# Patient Record
Sex: Male | Born: 1955 | Race: White | Hispanic: No | Marital: Single | Smoking: Former smoker
Health system: Southern US, Community
[De-identification: ages and names within clinical notes are randomized; demographics above are authoritative.]

## PROBLEM LIST (undated history)

## (undated) DIAGNOSIS — I10 Essential (primary) hypertension: Secondary | ICD-10-CM

## (undated) DIAGNOSIS — E119 Type 2 diabetes mellitus without complications: Secondary | ICD-10-CM

## (undated) DIAGNOSIS — I2510 Atherosclerotic heart disease of native coronary artery without angina pectoris: Secondary | ICD-10-CM

## (undated) HISTORY — PX: CARPAL TUNNEL RELEASE: SHX101

## (undated) HISTORY — PX: TOE AMPUTATION: SHX809

## (undated) HISTORY — PX: BELOW KNEE LEG AMPUTATION: SUR23

---

## 2016-06-16 ENCOUNTER — Inpatient Hospital Stay (HOSPITAL_COMMUNITY)
Admission: EM | Admit: 2016-06-16 | Discharge: 2016-06-27 | DRG: 233 | Disposition: A | Discharge status: Skilled Nursing Facility | Payer: BLUE CROSS/BLUE SHIELD | Attending: Cardiothoracic Surgery | Admitting: Cardiothoracic Surgery

## 2016-06-16 ENCOUNTER — Emergency Department (HOSPITAL_COMMUNITY): Payer: BLUE CROSS/BLUE SHIELD | Admitting: Anesthesiology

## 2016-06-16 ENCOUNTER — Encounter (HOSPITAL_COMMUNITY): Payer: Self-pay

## 2016-06-16 ENCOUNTER — Encounter (HOSPITAL_COMMUNITY)
Admission: EM | Disposition: A | Discharge status: Skilled Nursing Facility | Payer: Self-pay | Source: Home / Self Care | Attending: Cardiothoracic Surgery

## 2016-06-16 ENCOUNTER — Inpatient Hospital Stay (HOSPITAL_COMMUNITY): Payer: BLUE CROSS/BLUE SHIELD

## 2016-06-16 ENCOUNTER — Emergency Department (HOSPITAL_COMMUNITY): Payer: BLUE CROSS/BLUE SHIELD

## 2016-06-16 DIAGNOSIS — Z87891 Personal history of nicotine dependence: Secondary | ICD-10-CM

## 2016-06-16 DIAGNOSIS — I213 ST elevation (STEMI) myocardial infarction of unspecified site: Secondary | ICD-10-CM | POA: Diagnosis not present

## 2016-06-16 DIAGNOSIS — Z955 Presence of coronary angioplasty implant and graft: Secondary | ICD-10-CM

## 2016-06-16 DIAGNOSIS — Z7902 Long term (current) use of antithrombotics/antiplatelets: Secondary | ICD-10-CM

## 2016-06-16 DIAGNOSIS — E8770 Fluid overload, unspecified: Secondary | ICD-10-CM | POA: Diagnosis present

## 2016-06-16 DIAGNOSIS — D62 Acute posthemorrhagic anemia: Secondary | ICD-10-CM | POA: Diagnosis not present

## 2016-06-16 DIAGNOSIS — Z794 Long term (current) use of insulin: Secondary | ICD-10-CM

## 2016-06-16 DIAGNOSIS — J9811 Atelectasis: Secondary | ICD-10-CM | POA: Diagnosis not present

## 2016-06-16 DIAGNOSIS — I2510 Atherosclerotic heart disease of native coronary artery without angina pectoris: Secondary | ICD-10-CM | POA: Diagnosis present

## 2016-06-16 DIAGNOSIS — Z951 Presence of aortocoronary bypass graft: Secondary | ICD-10-CM

## 2016-06-16 DIAGNOSIS — R570 Cardiogenic shock: Secondary | ICD-10-CM | POA: Diagnosis present

## 2016-06-16 DIAGNOSIS — I2109 ST elevation (STEMI) myocardial infarction involving other coronary artery of anterior wall: Principal | ICD-10-CM | POA: Diagnosis present

## 2016-06-16 DIAGNOSIS — Z9641 Presence of insulin pump (external) (internal): Secondary | ICD-10-CM | POA: Diagnosis present

## 2016-06-16 DIAGNOSIS — Z8249 Family history of ischemic heart disease and other diseases of the circulatory system: Secondary | ICD-10-CM

## 2016-06-16 DIAGNOSIS — I10 Essential (primary) hypertension: Secondary | ICD-10-CM | POA: Diagnosis present

## 2016-06-16 DIAGNOSIS — J189 Pneumonia, unspecified organism: Secondary | ICD-10-CM | POA: Diagnosis present

## 2016-06-16 DIAGNOSIS — J811 Chronic pulmonary edema: Secondary | ICD-10-CM | POA: Diagnosis present

## 2016-06-16 DIAGNOSIS — E1042 Type 1 diabetes mellitus with diabetic polyneuropathy: Secondary | ICD-10-CM | POA: Diagnosis present

## 2016-06-16 DIAGNOSIS — I25110 Atherosclerotic heart disease of native coronary artery with unstable angina pectoris: Secondary | ICD-10-CM

## 2016-06-16 DIAGNOSIS — I255 Ischemic cardiomyopathy: Secondary | ICD-10-CM | POA: Diagnosis present

## 2016-06-16 DIAGNOSIS — R262 Difficulty in walking, not elsewhere classified: Secondary | ICD-10-CM

## 2016-06-16 DIAGNOSIS — I429 Cardiomyopathy, unspecified: Secondary | ICD-10-CM | POA: Diagnosis not present

## 2016-06-16 DIAGNOSIS — E119 Type 2 diabetes mellitus without complications: Secondary | ICD-10-CM | POA: Diagnosis not present

## 2016-06-16 DIAGNOSIS — I313 Pericardial effusion (noninflammatory): Secondary | ICD-10-CM | POA: Diagnosis present

## 2016-06-16 DIAGNOSIS — Z89511 Acquired absence of right leg below knee: Secondary | ICD-10-CM

## 2016-06-16 DIAGNOSIS — Z419 Encounter for procedure for purposes other than remedying health state, unspecified: Secondary | ICD-10-CM

## 2016-06-16 DIAGNOSIS — R5381 Other malaise: Secondary | ICD-10-CM | POA: Diagnosis not present

## 2016-06-16 DIAGNOSIS — I2101 ST elevation (STEMI) myocardial infarction involving left main coronary artery: Secondary | ICD-10-CM | POA: Diagnosis not present

## 2016-06-16 DIAGNOSIS — R0602 Shortness of breath: Secondary | ICD-10-CM

## 2016-06-16 HISTORY — DX: Atherosclerotic heart disease of native coronary artery without angina pectoris: I25.10

## 2016-06-16 HISTORY — PX: CORONARY ARTERY BYPASS GRAFT: SHX141

## 2016-06-16 HISTORY — DX: Type 2 diabetes mellitus without complications: E11.9

## 2016-06-16 HISTORY — DX: Essential (primary) hypertension: I10

## 2016-06-16 HISTORY — PX: CARDIAC CATHETERIZATION: SHX172

## 2016-06-16 HISTORY — PX: ENDOVEIN HARVEST OF GREATER SAPHENOUS VEIN: SHX5059

## 2016-06-16 LAB — POCT I-STAT, CHEM 8
BUN: 23 mg/dL — AB (ref 6–20)
BUN: 21 mg/dL — AB (ref 6–20)
BUN: 26 mg/dL — AB (ref 6–20)
BUN: 20 mg/dL (ref 6–20)
BUN: 19 mg/dL (ref 6–20)
BUN: 18 mg/dL (ref 6–20)
CALCIUM ION: 1.17 mmol/L (ref 1.15–1.40)
CALCIUM ION: 1.02 mmol/L — AB (ref 1.15–1.40)
CHLORIDE: 98 mmol/L — AB (ref 101–111)
CHLORIDE: 98 mmol/L — AB (ref 101–111)
CHLORIDE: 96 mmol/L — AB (ref 101–111)
CHLORIDE: 97 mmol/L — AB (ref 101–111)
CREATININE: 0.7 mg/dL (ref 0.61–1.24)
CREATININE: 0.7 mg/dL (ref 0.61–1.24)
CREATININE: 0.7 mg/dL (ref 0.61–1.24)
Calcium, Ion: 1.11 mmol/L — ABNORMAL LOW (ref 1.15–1.40)
Calcium, Ion: 1.2 mmol/L (ref 1.15–1.40)
Calcium, Ion: 1 mmol/L — ABNORMAL LOW (ref 1.15–1.40)
Calcium, Ion: 1.04 mmol/L — ABNORMAL LOW (ref 1.15–1.40)
Chloride: 95 mmol/L — ABNORMAL LOW (ref 101–111)
Chloride: 97 mmol/L — ABNORMAL LOW (ref 101–111)
Creatinine, Ser: 0.9 mg/dL (ref 0.61–1.24)
Creatinine, Ser: 0.9 mg/dL (ref 0.61–1.24)
Creatinine, Ser: 0.8 mg/dL (ref 0.61–1.24)
GLUCOSE: 218 mg/dL — AB (ref 65–99)
Glucose, Bld: 173 mg/dL — ABNORMAL HIGH (ref 65–99)
Glucose, Bld: 200 mg/dL — ABNORMAL HIGH (ref 65–99)
Glucose, Bld: 210 mg/dL — ABNORMAL HIGH (ref 65–99)
Glucose, Bld: 180 mg/dL — ABNORMAL HIGH (ref 65–99)
Glucose, Bld: 179 mg/dL — ABNORMAL HIGH (ref 65–99)
HCT: 37 % — ABNORMAL LOW (ref 39.0–52.0)
HEMATOCRIT: 42 % (ref 39.0–52.0)
HEMATOCRIT: 41 % (ref 39.0–52.0)
HEMATOCRIT: 27 % — AB (ref 39.0–52.0)
HEMATOCRIT: 28 % — AB (ref 39.0–52.0)
HEMATOCRIT: 26 % — AB (ref 39.0–52.0)
Hemoglobin: 14.3 g/dL (ref 13.0–17.0)
Hemoglobin: 13.9 g/dL (ref 13.0–17.0)
Hemoglobin: 12.6 g/dL — ABNORMAL LOW (ref 13.0–17.0)
Hemoglobin: 9.2 g/dL — ABNORMAL LOW (ref 13.0–17.0)
Hemoglobin: 9.5 g/dL — ABNORMAL LOW (ref 13.0–17.0)
Hemoglobin: 8.8 g/dL — ABNORMAL LOW (ref 13.0–17.0)
POTASSIUM: 4 mmol/L (ref 3.5–5.1)
POTASSIUM: 4.3 mmol/L (ref 3.5–5.1)
POTASSIUM: 4 mmol/L (ref 3.5–5.1)
POTASSIUM: 4.1 mmol/L (ref 3.5–5.1)
Potassium: 4.1 mmol/L (ref 3.5–5.1)
Potassium: 4.6 mmol/L (ref 3.5–5.1)
SODIUM: 140 mmol/L (ref 135–145)
SODIUM: 138 mmol/L (ref 135–145)
SODIUM: 137 mmol/L (ref 135–145)
SODIUM: 136 mmol/L (ref 135–145)
SODIUM: 136 mmol/L (ref 135–145)
Sodium: 137 mmol/L (ref 135–145)
TCO2: 27 mmol/L (ref 0–100)
TCO2: 26 mmol/L (ref 0–100)
TCO2: 26 mmol/L (ref 0–100)
TCO2: 28 mmol/L (ref 0–100)
TCO2: 29 mmol/L (ref 0–100)
TCO2: 25 mmol/L (ref 0–100)

## 2016-06-16 LAB — CBC
HEMATOCRIT: 41.2 % (ref 39.0–52.0)
Hemoglobin: 13.5 g/dL (ref 13.0–17.0)
MCH: 29.2 pg (ref 26.0–34.0)
MCHC: 32.8 g/dL (ref 30.0–36.0)
MCV: 89 fL (ref 78.0–100.0)
PLATELETS: 281 10*3/uL (ref 150–400)
RBC: 4.63 MIL/uL (ref 4.22–5.81)
RDW: 13 % (ref 11.5–15.5)
WBC: 13.4 10*3/uL — AB (ref 4.0–10.5)

## 2016-06-16 LAB — POCT I-STAT 3, ART BLOOD GAS (G3+)
ACID-BASE EXCESS: 3 mmol/L — AB (ref 0.0–2.0)
ACID-BASE EXCESS: 2 mmol/L (ref 0.0–2.0)
Acid-Base Excess: 1 mmol/L (ref 0.0–2.0)
Acid-Base Excess: 5 mmol/L — ABNORMAL HIGH (ref 0.0–2.0)
BICARBONATE: 27.8 mmol/L (ref 20.0–28.0)
BICARBONATE: 30 mmol/L — AB (ref 20.0–28.0)
BICARBONATE: 26.4 mmol/L (ref 20.0–28.0)
Bicarbonate: 26.9 mmol/L (ref 20.0–28.0)
O2 SAT: 94 %
O2 SAT: 96 %
O2 SAT: 100 %
O2 SAT: 99 %
PCO2 ART: 47.1 mmHg (ref 32.0–48.0)
PCO2 ART: 47.2 mmHg (ref 32.0–48.0)
PO2 ART: 86 mmHg (ref 83.0–108.0)
PO2 ART: 239 mmHg — AB (ref 83.0–108.0)
PO2 ART: 118 mmHg — AB (ref 83.0–108.0)
TCO2: 29 mmol/L (ref 0–100)
TCO2: 28 mmol/L (ref 0–100)
TCO2: 31 mmol/L (ref 0–100)
TCO2: 28 mmol/L (ref 0–100)
pCO2 arterial: 40.9 mmHg (ref 32.0–48.0)
pCO2 arterial: 38.9 mmHg (ref 32.0–48.0)
pH, Arterial: 7.439 (ref 7.350–7.450)
pH, Arterial: 7.365 (ref 7.350–7.450)
pH, Arterial: 7.411 (ref 7.350–7.450)
pH, Arterial: 7.44 (ref 7.350–7.450)
pO2, Arterial: 69 mmHg — ABNORMAL LOW (ref 83.0–108.0)

## 2016-06-16 LAB — HEMOGLOBIN AND HEMATOCRIT, BLOOD
HCT: 24.9 % — ABNORMAL LOW (ref 39.0–52.0)
Hemoglobin: 8.4 g/dL — ABNORMAL LOW (ref 13.0–17.0)

## 2016-06-16 LAB — BASIC METABOLIC PANEL
ANION GAP: 9 (ref 5–15)
BUN: 23 mg/dL — AB (ref 6–20)
CALCIUM: 8.9 mg/dL (ref 8.9–10.3)
CO2: 29 mmol/L (ref 22–32)
Chloride: 95 mmol/L — ABNORMAL LOW (ref 101–111)
Creatinine, Ser: 1.08 mg/dL (ref 0.61–1.24)
GFR calc Af Amer: >60 mL/min (ref >60)
Glucose, Bld: 137 mg/dL — ABNORMAL HIGH (ref 65–99)
POTASSIUM: 4.2 mmol/L (ref 3.5–5.1)
SODIUM: 133 mmol/L — AB (ref 135–145)

## 2016-06-16 LAB — LIPID PANEL
CHOLESTEROL: 116 mg/dL (ref 0–200)
HDL: 44 mg/dL (ref >40)
LDL CALC: 51 mg/dL (ref 0–99)
Total CHOL/HDL Ratio: 2.6 RATIO
Triglycerides: 104 mg/dL (ref <150)
VLDL: 21 mg/dL (ref 0–40)

## 2016-06-16 LAB — DIFFERENTIAL
BASOS PCT: 0 %
Basophils Absolute: 0 10*3/uL (ref 0.0–0.1)
EOS PCT: 1 %
Eosinophils Absolute: 0.1 10*3/uL (ref 0.0–0.7)
Lymphocytes Relative: 11 %
Lymphs Abs: 1.5 10*3/uL (ref 0.7–4.0)
MONO ABS: 1.4 10*3/uL — AB (ref 0.1–1.0)
MONOS PCT: 10 %
NEUTROS ABS: 10.5 10*3/uL — AB (ref 1.7–7.7)
Neutrophils Relative %: 78 %

## 2016-06-16 LAB — COMPREHENSIVE METABOLIC PANEL
ALT: 37 U/L (ref 17–63)
AST: 50 U/L — ABNORMAL HIGH (ref 15–41)
Albumin: 3.9 g/dL (ref 3.5–5.0)
Alkaline Phosphatase: 85 U/L (ref 38–126)
Anion gap: 11 (ref 5–15)
BILIRUBIN TOTAL: 0.4 mg/dL (ref 0.3–1.2)
BUN: 23 mg/dL — ABNORMAL HIGH (ref 6–20)
CALCIUM: 9.2 mg/dL (ref 8.9–10.3)
CO2: 28 mmol/L (ref 22–32)
CREATININE: 1.05 mg/dL (ref 0.61–1.24)
Chloride: 98 mmol/L — ABNORMAL LOW (ref 101–111)
GFR calc non Af Amer: >60 mL/min (ref >60)
GLUCOSE: 136 mg/dL — AB (ref 65–99)
Potassium: 4.3 mmol/L (ref 3.5–5.1)
SODIUM: 137 mmol/L (ref 135–145)
TOTAL PROTEIN: 7.5 g/dL (ref 6.5–8.1)

## 2016-06-16 LAB — I-STAT TROPONIN, ED: TROPONIN I, POC: 1.57 ng/mL — AB (ref 0.00–0.08)

## 2016-06-16 LAB — ABO/RH: ABO/RH(D): B POS

## 2016-06-16 LAB — PLATELET COUNT: Platelets: 167 10*3/uL (ref 150–400)

## 2016-06-16 LAB — TROPONIN I: Troponin I: 1.96 ng/mL (ref <0.03)

## 2016-06-16 LAB — PROTIME-INR
INR: 0.94
PROTHROMBIN TIME: 12.6 s (ref 11.4–15.2)

## 2016-06-16 LAB — APTT: aPTT: 32 seconds (ref 24–36)

## 2016-06-16 LAB — PREPARE RBC (CROSSMATCH)

## 2016-06-16 LAB — FIBRINOGEN: Fibrinogen: 289 mg/dL (ref 210–475)

## 2016-06-16 SURGERY — LEFT HEART CATH AND CORONARY ANGIOGRAPHY
Anesthesia: LOCAL

## 2016-06-16 SURGERY — CORONARY ARTERY BYPASS GRAFTING (CABG)
Anesthesia: General | Site: Leg Upper

## 2016-06-16 MED ORDER — VERAPAMIL HCL 2.5 MG/ML IV SOLN
INTRAVENOUS | Status: AC
  Filled 2016-06-16: qty 2

## 2016-06-16 MED ORDER — ONDANSETRON HCL 4 MG/2ML IJ SOLN
INTRAMUSCULAR | Status: AC
  Filled 2016-06-16: qty 2

## 2016-06-16 MED ORDER — DOPAMINE-DEXTROSE 3.2-5 MG/ML-% IV SOLN
0.0000 ug/kg/min | INTRAVENOUS | Status: DC
  Filled 2016-06-16: qty 250

## 2016-06-16 MED ORDER — HEPARIN (PORCINE) IN NACL 2-0.9 UNIT/ML-% IJ SOLN
INTRAMUSCULAR | Status: AC
  Filled 2016-06-16: qty 500

## 2016-06-16 MED ORDER — LIDOCAINE 2% (20 MG/ML) 5 ML SYRINGE
INTRAMUSCULAR | Status: AC
  Filled 2016-06-16: qty 5

## 2016-06-16 MED ORDER — DIAZEPAM 5 MG PO TABS: 5.0000 mg | ORAL_TABLET | Freq: Once | ORAL | Status: DC

## 2016-06-16 MED ORDER — ASPIRIN 81 MG PO CHEW
CHEWABLE_TABLET | ORAL | Status: AC
  Filled 2016-06-16: qty 4

## 2016-06-16 MED ORDER — TRANEXAMIC ACID (OHS) BOLUS VIA INFUSION
15.0000 mg/kg | INTRAVENOUS | Status: AC
  Administered 2016-06-16: 1258.5 mg via INTRAVENOUS
  Filled 2016-06-16: qty 1259

## 2016-06-16 MED ORDER — PHENYLEPHRINE HCL 10 MG/ML IJ SOLN
INTRAMUSCULAR | Status: DC | PRN
  Administered 2016-06-16: 80 ug via INTRAVENOUS

## 2016-06-16 MED ORDER — NITROGLYCERIN 0.4 MG SL SUBL
0.4000 mg | SUBLINGUAL_TABLET | SUBLINGUAL | Status: DC | PRN
  Administered 2016-06-16: 0.4 mg via SUBLINGUAL
  Filled 2016-06-16 (×2): qty 1

## 2016-06-16 MED ORDER — ROCURONIUM BROMIDE 10 MG/ML (PF) SYRINGE
PREFILLED_SYRINGE | INTRAVENOUS | Status: DC | PRN
  Administered 2016-06-16 – 2016-06-17 (×4): 50 mg via INTRAVENOUS

## 2016-06-16 MED ORDER — HEMOSTATIC AGENTS (NO CHARGE) OPTIME
TOPICAL | Status: DC | PRN
  Administered 2016-06-16: 1 via TOPICAL

## 2016-06-16 MED ORDER — PROTAMINE SULFATE 10 MG/ML IV SOLN
INTRAVENOUS | Status: AC
  Filled 2016-06-16: qty 5

## 2016-06-16 MED ORDER — SODIUM CHLORIDE 0.9 % IV SOLN
1250.0000 mg | INTRAVENOUS | Status: AC
  Administered 2016-06-16: 1250 mg via INTRAVENOUS
  Filled 2016-06-16: qty 1250

## 2016-06-16 MED ORDER — LACTATED RINGERS IV SOLN
INTRAVENOUS | Status: DC | PRN
  Administered 2016-06-16 – 2016-06-17 (×2): via INTRAVENOUS

## 2016-06-16 MED ORDER — EPINEPHRINE PF 1 MG/10ML IJ SOSY
PREFILLED_SYRINGE | INTRAMUSCULAR | Status: DC | PRN
  Administered 2016-06-16: 0.2 mg via INTRAVENOUS

## 2016-06-16 MED ORDER — HEMOSTATIC AGENTS (NO CHARGE) OPTIME
TOPICAL | Status: DC | PRN
  Administered 2016-06-16: 1 via TOPICAL
  Administered 2016-06-16: 3 via TOPICAL

## 2016-06-16 MED ORDER — HEPARIN (PORCINE) IN NACL 2-0.9 UNIT/ML-% IJ SOLN
INTRAMUSCULAR | Status: AC
  Filled 2016-06-16: qty 1500

## 2016-06-16 MED ORDER — SUCCINYLCHOLINE CHLORIDE 200 MG/10ML IV SOSY
PREFILLED_SYRINGE | INTRAVENOUS | Status: AC
  Filled 2016-06-16: qty 10

## 2016-06-16 MED ORDER — SODIUM CHLORIDE 0.9 % IJ SOLN
INTRAMUSCULAR | Status: AC
  Filled 2016-06-16: qty 10

## 2016-06-16 MED ORDER — MIDAZOLAM HCL 10 MG/2ML IJ SOLN
INTRAMUSCULAR | Status: AC
  Filled 2016-06-16: qty 2

## 2016-06-16 MED ORDER — EPHEDRINE 5 MG/ML INJ
INTRAVENOUS | Status: AC
  Filled 2016-06-16: qty 20

## 2016-06-16 MED ORDER — TEMAZEPAM 15 MG PO CAPS: 15.0000 mg | ORAL_CAPSULE | Freq: Once | ORAL | Status: DC | PRN

## 2016-06-16 MED ORDER — SUCCINYLCHOLINE CHLORIDE 200 MG/10ML IV SOSY
PREFILLED_SYRINGE | INTRAVENOUS | Status: AC
  Filled 2016-06-16: qty 20

## 2016-06-16 MED ORDER — FENTANYL CITRATE (PF) 250 MCG/5ML IJ SOLN
INTRAMUSCULAR | Status: AC
  Filled 2016-06-16: qty 20

## 2016-06-16 MED ORDER — LIDOCAINE HCL (PF) 1 % IJ SOLN
INTRAMUSCULAR | Status: DC | PRN
  Administered 2016-06-16: 15 mL
  Administered 2016-06-16: 2 mL

## 2016-06-16 MED ORDER — CHLORHEXIDINE GLUCONATE CLOTH 2 % EX PADS: 6.0000 | MEDICATED_PAD | Freq: Once | CUTANEOUS | Status: DC

## 2016-06-16 MED ORDER — CHLORHEXIDINE GLUCONATE 0.12 % MT SOLN
15.0000 mL | Freq: Once | OROMUCOSAL | Status: DC
  Filled 2016-06-16: qty 15

## 2016-06-16 MED ORDER — PHENYLEPHRINE HCL 10 MG/ML IJ SOLN
30.0000 ug/min | INTRAMUSCULAR | Status: DC
Start: 2016-06-16 — End: 2016-06-17
  Filled 2016-06-16: qty 2

## 2016-06-16 MED ORDER — ROCURONIUM BROMIDE 50 MG/5ML IV SOSY
PREFILLED_SYRINGE | INTRAVENOUS | Status: AC
  Filled 2016-06-16: qty 5

## 2016-06-16 MED ORDER — MIDAZOLAM HCL 5 MG/5ML IJ SOLN
INTRAMUSCULAR | Status: DC | PRN
  Administered 2016-06-16 (×2): 2 mg via INTRAVENOUS
  Administered 2016-06-16: 4 mg via INTRAVENOUS
  Administered 2016-06-16: 2 mg via INTRAVENOUS

## 2016-06-16 MED ORDER — PROPOFOL 10 MG/ML IV BOLUS
INTRAVENOUS | Status: AC
  Filled 2016-06-16: qty 20

## 2016-06-16 MED ORDER — PLASMA-LYTE 148 IV SOLN
INTRAVENOUS | Status: AC
  Administered 2016-06-16: 400 mL
  Filled 2016-06-16: qty 2.5

## 2016-06-16 MED ORDER — HEPARIN SODIUM (PORCINE) 1000 UNIT/ML IJ SOLN
INTRAMUSCULAR | Status: AC
  Filled 2016-06-16: qty 1

## 2016-06-16 MED ORDER — TRANEXAMIC ACID (OHS) PUMP PRIME SOLUTION
2.0000 mg/kg | INTRAVENOUS | Status: DC
  Filled 2016-06-16: qty 1.68

## 2016-06-16 MED ORDER — PHENYLEPHRINE 40 MCG/ML (10ML) SYRINGE FOR IV PUSH (FOR BLOOD PRESSURE SUPPORT)
PREFILLED_SYRINGE | INTRAVENOUS | Status: AC
  Filled 2016-06-16: qty 10

## 2016-06-16 MED ORDER — OSELTAMIVIR PHOSPHATE 6 MG/ML PO SUSR
75.0000 mg | Freq: Once | ORAL | Status: AC
  Administered 2016-06-17: 75 mg
  Filled 2016-06-16: qty 12.5

## 2016-06-16 MED ORDER — VERAPAMIL HCL 2.5 MG/ML IV SOLN
INTRAVENOUS | Status: DC | PRN
  Administered 2016-06-16: 10 mL via INTRA_ARTERIAL

## 2016-06-16 MED ORDER — ROCURONIUM BROMIDE 50 MG/5ML IV SOSY
PREFILLED_SYRINGE | INTRAVENOUS | Status: AC
  Filled 2016-06-16: qty 15

## 2016-06-16 MED ORDER — DEXTROSE 5 % IV SOLN
1.5000 g | INTRAVENOUS | Status: AC
  Administered 2016-06-16: .75 g via INTRAVENOUS
  Administered 2016-06-16: 1.5 g via INTRAVENOUS
  Filled 2016-06-16: qty 1.5

## 2016-06-16 MED ORDER — POTASSIUM CHLORIDE 2 MEQ/ML IV SOLN
80.0000 meq | INTRAVENOUS | Status: DC
  Filled 2016-06-16: qty 40

## 2016-06-16 MED ORDER — ALBUTEROL SULFATE HFA 108 (90 BASE) MCG/ACT IN AERS
INHALATION_SPRAY | RESPIRATORY_TRACT | Status: DC | PRN
  Administered 2016-06-16 – 2016-06-17 (×2): 4 via RESPIRATORY_TRACT

## 2016-06-16 MED ORDER — HEPARIN (PORCINE) IN NACL 2-0.9 UNIT/ML-% IJ SOLN
INTRAMUSCULAR | Status: DC | PRN
  Administered 2016-06-16: 1500 mL

## 2016-06-16 MED ORDER — SODIUM CHLORIDE 0.9 % IV SOLN
10.0000 mL/h | INTRAVENOUS | Status: DC
  Administered 2016-06-16: 20 mL/h via INTRAVENOUS

## 2016-06-16 MED ORDER — IOPAMIDOL (ISOVUE-370) INJECTION 76%
INTRAVENOUS | Status: DC | PRN
  Administered 2016-06-16: 55 mL via INTRA_ARTERIAL

## 2016-06-16 MED ORDER — ASPIRIN 81 MG PO CHEW
324.0000 mg | CHEWABLE_TABLET | Freq: Once | ORAL | Status: AC
  Administered 2016-06-16: 324 mg via ORAL

## 2016-06-16 MED ORDER — MAGNESIUM SULFATE 50 % IJ SOLN
40.0000 meq | INTRAMUSCULAR | Status: DC
  Filled 2016-06-16: qty 10

## 2016-06-16 MED ORDER — HEPARIN SODIUM (PORCINE) 1000 UNIT/ML IJ SOLN
INTRAMUSCULAR | Status: DC | PRN
  Administered 2016-06-16: 5000 [IU] via INTRAVENOUS

## 2016-06-16 MED ORDER — HEPARIN SODIUM (PORCINE) 5000 UNIT/ML IJ SOLN
4000.0000 [IU] | INTRAMUSCULAR | Status: AC
  Administered 2016-06-16: 4000 [IU] via INTRAVENOUS
  Filled 2016-06-16: qty 0.8

## 2016-06-16 MED ORDER — SODIUM CHLORIDE 0.9 % IV SOLN: Freq: Once | INTRAVENOUS | Status: DC

## 2016-06-16 MED ORDER — ATROPINE SULFATE 1 MG/ML IJ SOLN
INTRAMUSCULAR | Status: AC
  Filled 2016-06-16: qty 1

## 2016-06-16 MED ORDER — BIVALIRUDIN 250 MG IV SOLR
INTRAVENOUS | Status: AC
  Filled 2016-06-16: qty 250

## 2016-06-16 MED ORDER — BIVALIRUDIN BOLUS VIA INFUSION - CUPID
INTRAVENOUS | Status: DC | PRN
  Administered 2016-06-16: 62.925 mg via INTRAVENOUS

## 2016-06-16 MED ORDER — DEXMEDETOMIDINE HCL IN NACL 400 MCG/100ML IV SOLN
0.1000 ug/kg/h | INTRAVENOUS | Status: AC
  Administered 2016-06-16: .3 ug/kg/h via INTRAVENOUS
  Filled 2016-06-16: qty 100

## 2016-06-16 MED ORDER — LACTATED RINGERS IV SOLN
INTRAVENOUS | Status: DC | PRN
  Administered 2016-06-16: 18:00:00 via INTRAVENOUS

## 2016-06-16 MED ORDER — FENTANYL CITRATE (PF) 250 MCG/5ML IJ SOLN
INTRAMUSCULAR | Status: AC
  Filled 2016-06-16: qty 5

## 2016-06-16 MED ORDER — IOPAMIDOL (ISOVUE-370) INJECTION 76%
INTRAVENOUS | Status: AC
  Filled 2016-06-16: qty 125

## 2016-06-16 MED ORDER — LIDOCAINE HCL (PF) 1 % IJ SOLN
INTRAMUSCULAR | Status: AC
  Filled 2016-06-16: qty 30

## 2016-06-16 MED ORDER — EPINEPHRINE PF 1 MG/ML IJ SOLN
0.0000 ug/min | INTRAVENOUS | Status: AC
  Administered 2016-06-16: 3 ug/min via INTRAVENOUS
  Filled 2016-06-16: qty 4

## 2016-06-16 MED ORDER — CEFAZOLIN SODIUM 1 G IJ SOLR
INTRAMUSCULAR | Status: AC
  Filled 2016-06-16: qty 20

## 2016-06-16 MED ORDER — LACTATED RINGERS IV SOLN
INTRAVENOUS | Status: DC | PRN
  Administered 2016-06-16 (×2): via INTRAVENOUS

## 2016-06-16 MED ORDER — SUCCINYLCHOLINE CHLORIDE 20 MG/ML IJ SOLN
INTRAMUSCULAR | Status: DC | PRN
  Administered 2016-06-16: 140 mg via INTRAVENOUS

## 2016-06-16 MED ORDER — ARTIFICIAL TEARS OP OINT
TOPICAL_OINTMENT | OPHTHALMIC | Status: AC
  Filled 2016-06-16: qty 3.5

## 2016-06-16 MED ORDER — PROTAMINE SULFATE 10 MG/ML IV SOLN
INTRAVENOUS | Status: DC | PRN
  Administered 2016-06-16: 170 mg via INTRAVENOUS

## 2016-06-16 MED ORDER — BISACODYL 5 MG PO TBEC
5.0000 mg | DELAYED_RELEASE_TABLET | Freq: Once | ORAL | Status: DC
  Filled 2016-06-16: qty 1

## 2016-06-16 MED ORDER — SODIUM CHLORIDE 0.9 % IV SOLN
INTRAVENOUS | Status: DC
  Filled 2016-06-16: qty 30

## 2016-06-16 MED ORDER — CHLORHEXIDINE GLUCONATE CLOTH 2 % EX PADS
6.0000 | MEDICATED_PAD | Freq: Once | CUTANEOUS | Status: DC
  Administered 2016-06-17: 6 via TOPICAL

## 2016-06-16 MED ORDER — NITROGLYCERIN IN D5W 200-5 MCG/ML-% IV SOLN
2.0000 ug/min | INTRAVENOUS | Status: AC
  Administered 2016-06-16: 10 ug/min via INTRAVENOUS
  Filled 2016-06-16: qty 250

## 2016-06-16 MED ORDER — LIDOCAINE 2% (20 MG/ML) 5 ML SYRINGE: INTRAMUSCULAR | Status: DC | PRN

## 2016-06-16 MED ORDER — PROTAMINE SULFATE 10 MG/ML IV SOLN
INTRAVENOUS | Status: AC
  Filled 2016-06-16: qty 25

## 2016-06-16 MED ORDER — ARTIFICIAL TEARS OP OINT
TOPICAL_OINTMENT | OPHTHALMIC | Status: DC | PRN
  Administered 2016-06-16: 1 via OPHTHALMIC

## 2016-06-16 MED ORDER — NOREPINEPHRINE BITARTRATE 1 MG/ML IV SOLN
0.0000 ug/min | INTRAVENOUS | Status: AC
  Administered 2016-06-16: 4 ug/min via INTRAVENOUS
  Filled 2016-06-16: qty 4

## 2016-06-16 MED ORDER — METOPROLOL TARTRATE 12.5 MG HALF TABLET: 12.5000 mg | ORAL_TABLET | Freq: Once | ORAL | Status: DC

## 2016-06-16 MED ORDER — PROPOFOL 10 MG/ML IV BOLUS
INTRAVENOUS | Status: DC | PRN
  Administered 2016-06-16: 30 mg via INTRAVENOUS

## 2016-06-16 MED ORDER — FENTANYL CITRATE (PF) 250 MCG/5ML IJ SOLN
INTRAMUSCULAR | Status: DC | PRN
  Administered 2016-06-16: 100 ug via INTRAVENOUS
  Administered 2016-06-16: 250 ug via INTRAVENOUS
  Administered 2016-06-16: 100 ug via INTRAVENOUS
  Administered 2016-06-16: 50 ug via INTRAVENOUS
  Administered 2016-06-16: 500 ug via INTRAVENOUS
  Administered 2016-06-16: 250 ug via INTRAVENOUS

## 2016-06-16 MED ORDER — 0.9 % SODIUM CHLORIDE (POUR BTL) OPTIME
TOPICAL | Status: DC | PRN
  Administered 2016-06-16: 1000 mL
  Administered 2016-06-16: 5000 mL

## 2016-06-16 MED ORDER — SODIUM CHLORIDE 0.9 % IV SOLN: INTRAVENOUS | Status: DC | PRN

## 2016-06-16 MED ORDER — VASOPRESSIN 20 UNIT/ML IV SOLN
0.0300 [IU]/min | INTRAVENOUS | Status: DC
  Filled 2016-06-16: qty 2

## 2016-06-16 MED ORDER — LIDOCAINE 2% (20 MG/ML) 5 ML SYRINGE
INTRAMUSCULAR | Status: AC
  Filled 2016-06-16: qty 10

## 2016-06-16 MED ORDER — SODIUM BICARBONATE 8.4 % IV SOLN
INTRAVENOUS | Status: AC
  Filled 2016-06-16: qty 50

## 2016-06-16 MED ORDER — TRANEXAMIC ACID 1000 MG/10ML IV SOLN
1.5000 mg/kg/h | INTRAVENOUS | Status: AC
  Administered 2016-06-16: 1.5 mg/kg/h via INTRAVENOUS
  Filled 2016-06-16 (×2): qty 25

## 2016-06-16 MED ORDER — SODIUM CHLORIDE 0.9 % IV SOLN
INTRAVENOUS | Status: AC
  Administered 2016-06-16: 3 [IU]/h via INTRAVENOUS
  Filled 2016-06-16: qty 2.5

## 2016-06-16 MED ORDER — HEPARIN SODIUM (PORCINE) 1000 UNIT/ML IJ SOLN
INTRAMUSCULAR | Status: DC | PRN
  Administered 2016-06-16: 2000 [IU] via INTRAVENOUS
  Administered 2016-06-16: 16000 [IU] via INTRAVENOUS
  Administered 2016-06-16: 5000 [IU] via INTRAVENOUS

## 2016-06-16 MED ORDER — DEXTROSE 5 % IV SOLN
750.0000 mg | INTRAVENOUS | Status: DC
  Filled 2016-06-16: qty 750

## 2016-06-16 MED ORDER — MILRINONE LACTATE IN DEXTROSE 20-5 MG/100ML-% IV SOLN
0.2500 ug/kg/min | INTRAVENOUS | Status: AC
  Administered 2016-06-16: .3 ug/kg/min via INTRAVENOUS
  Filled 2016-06-16: qty 100

## 2016-06-16 SURGICAL SUPPLY — 97 items
ADAPTER CARDIO PERF ANTE/RETRO (ADAPTER) ×4 IMPLANT
BAG DECANTER FOR FLEXI CONT (MISCELLANEOUS) ×4 IMPLANT
BANDAGE ACE 4X5 VEL STRL LF (GAUZE/BANDAGES/DRESSINGS) ×4 IMPLANT
BANDAGE ACE 6X5 VEL STRL LF (GAUZE/BANDAGES/DRESSINGS) ×4 IMPLANT
BASKET HEART  (ORDER IN 25'S) (MISCELLANEOUS) ×1
BASKET HEART (ORDER IN 25'S) (MISCELLANEOUS) ×1
BASKET HEART (ORDER IN 25S) (MISCELLANEOUS) ×2 IMPLANT
BLADE STERNUM SYSTEM 6 (BLADE) ×4 IMPLANT
BLADE SURG 12 STRL SS (BLADE) ×4 IMPLANT
BLADE SURG ROTATE 9660 (MISCELLANEOUS) IMPLANT
BNDG GAUZE ELAST 4 BULKY (GAUZE/BANDAGES/DRESSINGS) ×4 IMPLANT
CANISTER SUCTION 2500CC (MISCELLANEOUS) ×4 IMPLANT
CANNULA GUNDRY RCSP 15FR (MISCELLANEOUS) ×4 IMPLANT
CATH CPB KIT VANTRIGT (MISCELLANEOUS) ×4 IMPLANT
CATH ROBINSON RED A/P 18FR (CATHETERS) ×12 IMPLANT
CATH THORACIC 36FR RT ANG (CATHETERS) ×4 IMPLANT
CLIP TI WIDE RED SMALL 24 (CLIP) ×4 IMPLANT
CRADLE DONUT ADULT HEAD (MISCELLANEOUS) ×4 IMPLANT
DRAIN CHANNEL 32F RND 10.7 FF (WOUND CARE) ×4 IMPLANT
DRAPE CARDIOVASCULAR INCISE (DRAPES) ×2
DRAPE SLUSH/WARMER DISC (DRAPES) ×4 IMPLANT
DRAPE SRG 135X102X78XABS (DRAPES) ×2 IMPLANT
DRSG AQUACEL AG ADV 3.5X14 (GAUZE/BANDAGES/DRESSINGS) ×4 IMPLANT
ELECT BLADE 4.0 EZ CLEAN MEGAD (MISCELLANEOUS) ×4
ELECT BLADE 6.5 EXT (BLADE) ×4 IMPLANT
ELECT CAUTERY BLADE 6.4 (BLADE) ×4 IMPLANT
ELECT REM PT RETURN 9FT ADLT (ELECTROSURGICAL) ×8
ELECTRODE BLDE 4.0 EZ CLN MEGD (MISCELLANEOUS) ×2 IMPLANT
ELECTRODE REM PT RTRN 9FT ADLT (ELECTROSURGICAL) ×4 IMPLANT
FELT TEFLON 1X6 (MISCELLANEOUS) ×8 IMPLANT
GAUZE SPONGE 4X4 12PLY STRL (GAUZE/BANDAGES/DRESSINGS) ×8 IMPLANT
GLOVE BIO SURGEON STRL SZ 6.5 (GLOVE) ×3 IMPLANT
GLOVE BIO SURGEON STRL SZ7.5 (GLOVE) ×12 IMPLANT
GLOVE BIO SURGEONS STRL SZ 6.5 (GLOVE) ×1
GLOVE BIOGEL PI IND STRL 6 (GLOVE) ×2 IMPLANT
GLOVE BIOGEL PI IND STRL 6.5 (GLOVE) ×8 IMPLANT
GLOVE BIOGEL PI INDICATOR 6 (GLOVE) ×2
GLOVE BIOGEL PI INDICATOR 6.5 (GLOVE) ×8
GOWN STRL REUS W/ TWL LRG LVL3 (GOWN DISPOSABLE) ×8 IMPLANT
GOWN STRL REUS W/TWL LRG LVL3 (GOWN DISPOSABLE) ×8
HEMOSTAT POWDER SURGIFOAM 1G (HEMOSTASIS) ×12 IMPLANT
HEMOSTAT SURGICEL 2X14 (HEMOSTASIS) ×4 IMPLANT
INSERT FOGARTY XLG (MISCELLANEOUS) IMPLANT
KIT BASIN OR (CUSTOM PROCEDURE TRAY) ×4 IMPLANT
KIT ROOM TURNOVER OR (KITS) ×4 IMPLANT
KIT SUCTION CATH 14FR (SUCTIONS) ×4 IMPLANT
KIT VASOVIEW HEMOPRO VH 3000 (KITS) ×4 IMPLANT
LEAD PACING MYOCARDI (MISCELLANEOUS) ×4 IMPLANT
MARKER GRAFT CORONARY BYPASS (MISCELLANEOUS) ×12 IMPLANT
NS IRRIG 1000ML POUR BTL (IV SOLUTION) ×20 IMPLANT
PACK OPEN HEART (CUSTOM PROCEDURE TRAY) ×4 IMPLANT
PAD ARMBOARD 7.5X6 YLW CONV (MISCELLANEOUS) ×8 IMPLANT
PAD ELECT DEFIB RADIOL ZOLL (MISCELLANEOUS) ×4 IMPLANT
PENCIL BUTTON HOLSTER BLD 10FT (ELECTRODE) ×4 IMPLANT
PUNCH AORTIC ROTATE  4.5MM 8IN (MISCELLANEOUS) ×4 IMPLANT
PUNCH AORTIC ROTATE 4.0MM (MISCELLANEOUS) IMPLANT
PUNCH AORTIC ROTATE 4.5MM 8IN (MISCELLANEOUS) IMPLANT
PUNCH AORTIC ROTATE 5MM 8IN (MISCELLANEOUS) IMPLANT
SET CARDIOPLEGIA MPS 5001102 (MISCELLANEOUS) ×4 IMPLANT
SOLUTION ANTI FOG 6CC (MISCELLANEOUS) ×4 IMPLANT
SPONGE GAUZE 4X4 12PLY STER LF (GAUZE/BANDAGES/DRESSINGS) ×8 IMPLANT
SPONGE LAP 18X18 X RAY DECT (DISPOSABLE) ×4 IMPLANT
SURGIFLO W/THROMBIN 8M KIT (HEMOSTASIS) ×4 IMPLANT
SUT BONE WAX W31G (SUTURE) ×4 IMPLANT
SUT MNCRL AB 4-0 PS2 18 (SUTURE) ×4 IMPLANT
SUT PROLENE 3 0 SH DA (SUTURE) IMPLANT
SUT PROLENE 3 0 SH1 36 (SUTURE) IMPLANT
SUT PROLENE 4 0 RB 1 (SUTURE) ×6
SUT PROLENE 4 0 SH DA (SUTURE) ×4 IMPLANT
SUT PROLENE 4-0 RB1 .5 CRCL 36 (SUTURE) ×6 IMPLANT
SUT PROLENE 5 0 C 1 36 (SUTURE) IMPLANT
SUT PROLENE 6 0 C 1 30 (SUTURE) IMPLANT
SUT PROLENE 6 0 CC (SUTURE) ×12 IMPLANT
SUT PROLENE 8 0 BV175 6 (SUTURE) ×8 IMPLANT
SUT PROLENE BLUE 7 0 (SUTURE) ×12 IMPLANT
SUT SILK  1 MH (SUTURE) ×2
SUT SILK 1 MH (SUTURE) ×2 IMPLANT
SUT SILK 2 0 SH CR/8 (SUTURE) ×4 IMPLANT
SUT SILK 3 0 SH CR/8 (SUTURE) IMPLANT
SUT STEEL 6MS V (SUTURE) ×12 IMPLANT
SUT STEEL SZ 6 DBL 3X14 BALL (SUTURE) ×4 IMPLANT
SUT VIC AB 1 CTX 36 (SUTURE) ×4
SUT VIC AB 1 CTX36XBRD ANBCTR (SUTURE) ×4 IMPLANT
SUT VIC AB 2-0 CT1 27 (SUTURE) ×2
SUT VIC AB 2-0 CT1 TAPERPNT 27 (SUTURE) ×2 IMPLANT
SUT VIC AB 2-0 CTX 27 (SUTURE) IMPLANT
SUT VIC AB 3-0 X1 27 (SUTURE) IMPLANT
SUTURE E-PAK OPEN HEART (SUTURE) ×4 IMPLANT
SYSTEM SAHARA CHEST DRAIN ATS (WOUND CARE) ×4 IMPLANT
TAPE CLOTH SURG 4X10 WHT LF (GAUZE/BANDAGES/DRESSINGS) ×4 IMPLANT
TAPE PAPER 3X10 WHT MICROPORE (GAUZE/BANDAGES/DRESSINGS) ×4 IMPLANT
TOWEL OR 17X24 6PK STRL BLUE (TOWEL DISPOSABLE) ×8 IMPLANT
TOWEL OR 17X26 10 PK STRL BLUE (TOWEL DISPOSABLE) ×8 IMPLANT
TRAY FOLEY IC TEMP SENS 16FR (CATHETERS) ×4 IMPLANT
TUBING INSUFFLATION (TUBING) ×4 IMPLANT
UNDERPAD 30X30 (UNDERPADS AND DIAPERS) ×4 IMPLANT
WATER STERILE IRR 1000ML POUR (IV SOLUTION) ×8 IMPLANT

## 2016-06-16 SURGICAL SUPPLY — 19 items
BALLN LINEAR 7.5FR IABP 40CC (BALLOONS) ×2
BALLOON LINEAR 7.5FR IABP 40CC (BALLOONS) ×1 IMPLANT
CATH EXPO 5FR FR4 (CATHETERS) ×2 IMPLANT
CATH INFINITI 5 FR JL3.5 (CATHETERS) ×2 IMPLANT
CATH INFINITI 5FR ANG PIGTAIL (CATHETERS) ×2 IMPLANT
DEVICE SECURE STATLOCK IABP (MISCELLANEOUS) ×4 IMPLANT
ELECT DEFIB PAD ADLT CADENCE (PAD) ×2 IMPLANT
GLIDESHEATH SLEND SS 6F .021 (SHEATH) ×2 IMPLANT
GUIDEWIRE INQWIRE 1.5J.035X260 (WIRE) ×1 IMPLANT
INQWIRE 1.5J .035X260CM (WIRE) ×2
KIT ENCORE 26 ADVANTAGE (KITS) ×2 IMPLANT
KIT HEART LEFT (KITS) ×2 IMPLANT
PACK CARDIAC CATHETERIZATION (CUSTOM PROCEDURE TRAY) ×2 IMPLANT
SHEATH PINNACLE 6F 10CM (SHEATH) ×2 IMPLANT
SHIELD RADPAD SCOOP 12X17 (MISCELLANEOUS) ×2 IMPLANT
SYR MEDRAD MARK V 150ML (SYRINGE) ×2 IMPLANT
TRANSDUCER W/STOPCOCK (MISCELLANEOUS) ×2 IMPLANT
TUBING CIL FLEX 10 FLL-RA (TUBING) ×2 IMPLANT
WIRE EMERALD 3MM-J .035X150CM (WIRE) ×2 IMPLANT

## 2016-06-16 NOTE — H&P (Addendum)
Patient ID: Lerone Onder MRN: [id number] DOB/AGE: 1955/10/19 61 y.o. Admit date: 06/16/2016  Primary Care Physician: No primary care provider on file. Primary Cardiologist: New ( he is followed by cardiology in [place], [place])  HPI: 61 yo male with history of DM type 1, HTN, CAD with prior stent placement in 2006 presented to [facility] ED with c/o chest pressure, cough, fatigue. He has had low grade fevers, cough and flu-like symptoms for 3 days. He had 3 coronary stents placed in [place] in 2006. No cath since then. EKG with 2 mm ST elevation AVR. Diffuse ST depression. Chest pain resolved upon arrival to cath lab.   Review of systems complete and found to be negative unless listed above   Past Medical History:  Diagnosis Date  . CAD (coronary artery disease)    3 stents placed in ? vessels in [place], [place] in 2006  . Diabetes mellitus without complication (HCC)   . Hypertension     Family History  Problem Relation Age of Onset  . CAD Father     Social History   Social History  . Marital status: N/A    Spouse name: N/A  . Number of children: N/A  . Years of education: N/A   Occupational History  . Not on file.   Social History Main Topics  . Smoking status: Former Games developer  . Smokeless tobacco: Former Neurosurgeon  . Alcohol use Yes     Comment: social  . Drug use: No  . Sexual activity: Not on file   Other Topics Concern  . Not on file   Social History Narrative  . No narrative on file    Past Surgical History:  Procedure Laterality Date  . BELOW KNEE LEG AMPUTATION Right   . CARPAL TUNNEL RELEASE    . TOE AMPUTATION Left     No Known Allergies  Prior to Admission Meds:  Prior to Admission medications   Not on File  Novolog via insulin pump Cymbalta Wellbutrin ASA 81 mg daily Plavix 75 mg daily Lipitor ? dose  Physical Exam: Blood pressure 109/62, pulse 83, temperature 99.4 F (37.4 C), temperature source Oral, resp. rate 15, height 5\' 10"  (1.778 m),  weight 185 lb (83.9 kg), SpO2 94 %.    General: Well developed, well nourished, NAD  HEENT: OP clear, mucus membranes moist  SKIN: warm, dry. No rashes.  Neuro: No focal deficits  Musculoskeletal: Muscle strength 5/5 all ext  Psychiatric: Mood and affect normal  Neck: No JVD, no carotid bruits, no thyromegaly, no lymphadenopathy.  Lungs:Clear bilaterally, no wheezes, rhonci, crackles  Cardiovascular: Regular rate and rhythm. No murmurs, gallops or rubs.  Abdomen:Soft. Bowel sounds present. Non-tender.  Extremities: No lower extremity edema. Pulses are 2 + in the bilateral DP/PT.   Labs:   Lab Results  Component Value Date   WBC 13.4 (H) 06/16/2016   HGB 13.5 06/16/2016   HCT 41.2 06/16/2016   MCV 89.0 06/16/2016   PLT 281 06/16/2016    Radiology: Chest x-ray: No pneumothorax. The heart size borderline. The hila and mediastinum are unremarkable. Interlobular septal thickening is seen peripherally suggesting edema. However, there are more focal opacities in the lung bases.  IMPRESSION: Findings suggest edema with interlobular septal thickening. However, the more focal opacities in the bases, left greater than right, are concerning for multifocal pneumonia given history.  EKG: sinus, 2 mm ST elevation AVR, diffuse ST depression  ASSESSMENT AND PLAN:   1. Acute STEMI: Pt  with ongoing chest pain, EKG changes concerning for diffuse ischemia. Plan emergent cardiac cath. Further plans to follow after cath.   2. Diabetes mellitus: He has an insulin pump.   Addendum: Cardiac cath with severe distal left main disease, severe proximal RCA stenosis, severe ostial LAD stenosis, severe ostial Circumflex stenosis, severe OM2 stenosis within old stent. LVEF 20-25% by LV gram. CT surgery has been called and Dr. Donata ClayVan Trigt is at the bedside. Echo at bedside. Planning for OR tonight for emergent bypass. Pt is pain free with IABP in place.   Earney Hamburghris McAlhany, MD 06/16/2016, 3:46 PM

## 2016-06-16 NOTE — ED Notes (Signed)
EMERGENCY CONTACT- SISTER- LISA BELL [phone]

## 2016-06-16 NOTE — Anesthesia Procedure Notes (Addendum)
Central Venous Catheter Insertion Performed by: JACKSON, CARSWELL, anesthesiologist Start/End1/12/2016 5:53 PM, 06/16/2016 6:04 PM Patient location: OR. Emergency situation Preanesthetic checklist: patient identified, IV checked, site marked, risks and benefits discussed, surgical consent, monitors and equipment checked, pre-op evaluation, timeout performed and anesthesia consent Position: Trendelenburg Lidocaine 1% used for infiltration Hand hygiene performed  and maximum sterile barriers used  Catheter size: 8.5 Fr PA cath was placed.Sheath introducer Swan type:thermodilution Procedure performed using ultrasound guided technique. Ultrasound Notes:anatomy identified, needle tip was noted to be adjacent to the nerve/plexus identified, no ultrasound evidence of intravascular and/or intraneural injection and image(s) printed for medical record Attempts: 1 Following insertion, line sutured and dressing applied. Post procedure assessment: blood return through all ports, free fluid flow and no air  Complications: wire in easily, cordis difficult to insert. Additional procedure comments: PA catheter:  Routine monitors. Timeout, sterile prep, drape, FBP R neck.  Trendelenburg position.  1% Lido local, finder and trocar RIJ 1st pass with US guidance.  Cordis placed over J wire, very difficult to insert, (wire position verified with re-insertion over angiocath). PA catheter in easily.  Sterile dressing applied.  Patient tolerated well, VSS.  C Jackson, MD.           

## 2016-06-16 NOTE — Anesthesia Procedure Notes (Signed)
Procedure Name: Intubation Date/Time: 06/16/2016 6:18 PM Performed by: Brien MatesMAHONY, Dylynn Ketner D Pre-anesthesia Checklist: Patient identified, Emergency Drugs available, Patient being monitored, Suction available and Timeout performed Patient Re-evaluated:Patient Re-evaluated prior to inductionOxygen Delivery Method: Circle system utilized Preoxygenation: Pre-oxygenation with 100% oxygen Intubation Type: IV induction Ventilation: Mask ventilation without difficulty Laryngoscope Size: Glidescope Grade View: Grade I Tube type: Subglottic suction tube Tube size: 8.0 mm Number of attempts: 1 Airway Equipment and Method: Stylet and Video-laryngoscopy Placement Confirmation: ETT inserted through vocal cords under direct vision,  positive ETCO2 and breath sounds checked- equal and bilateral Secured at: 22 cm Tube secured with: Tape Dental Injury: Teeth and Oropharynx as per pre-operative assessment

## 2016-06-16 NOTE — ED Notes (Signed)
PT TRANSFERRED TO [facility] CATH LAB. AAOX4. PT IN NO APPARENT DISTRESS. THE OPPORTUNITY TO ASK QUESTIONS WAS PROVIDED.

## 2016-06-16 NOTE — ED Notes (Signed)
Critical Istat result given to EDP

## 2016-06-16 NOTE — Brief Op Note (Signed)
06/16/2016  10:23 PM  PATIENT:  Bryce Ramirez  61 y.o. male  PRE-OPERATIVE DIAGNOSIS:  MYOCARDIAL INFARCTION - LEFT MAIN  POST-OPERATIVE DIAGNOSIS:  MYOCARDIAL INFARCTION - LEFT MAIN  PROCEDURE:  Procedure(s): CORONARY ARTERY BYPASS GRAFTING (CABG) (N/A)  SVG to RCA SVG to circ LIMA to LAD  SURGEON:  Surgeon(s) and Role:    * Kerin PernaPeter Van Trigt, MD - Primary  PHYSICIAN ASSISTANT:  Jari Favreessa Max Romano, PA-C  ANESTHESIA:   general  EBL:  Total I/O In: -  Out: 1330 [Urine:1330]  BLOOD ADMINISTERED:none  DRAINS: ROUTINE   LOCAL MEDICATIONS USED:  NONE  SPECIMEN:  No Specimen  DISPOSITION OF SPECIMEN:  N/A  COUNTS:  YES  TOURNIQUET:  * No tourniquets in log *  DICTATION: .Other Dictation: Dictation Number [id number]  PLAN OF CARE: Admit to inpatient   PATIENT DISPOSITION:  ICU - intubated and hemodynamically stable.   Delay start of Pharmacological VTE agent (>24hrs) due to surgical blood loss or risk of bleeding: yes

## 2016-06-16 NOTE — ED Triage Notes (Signed)
Patient Bryce Ramirez, c/o flu like symptoms x1 week, generalized body aches and productive cough.  Patient O2 sat on EMS arrival were 87% on RA.  Patient not c/o SOB or chest pain.  Patient applied to 3L [place] in EMS and O2 sats currently at 91%.

## 2016-06-16 NOTE — Consult Note (Signed)
[address]       [place] [address]             [phone]        Bryce Ramirez [facility] Medical Record #[id number] Date of Birth: [birth year]  Referring:  Lauretta Grill, MD Primary Care: No primary care provider on file.  Chief Complaint:    Chief Complaint  Patient presents with  . Chest Pain  . flu like symptoms   Patient examined, cardiac catheterization images reviewed, bedside echocardiogram and portal chest x-ray personally reviewed. Patient discussed with his cardiologist Dr.CMac  in the cardiac cath Ramirez to provide acute coordination of care.  History of Present Illness:     61 year old type I diabetic Caucasian male form smoker, reformed alcoholic with known history of CAD status post prior PCI stents in [place] 2009 presents with chest pain shortness of breath. In the emergency department he was found to have a ST elevation MI with positive enzymes and EKG changes. Chest x-ray showed pulmonary edema and he was brought directly to the cath Ramirez. He was found have a 95% distal left main stenosis, proximal 95% stenosis of the RCA and 99% ostial stenosis of the LAD and circumflex. Previously placed stents have high-grade stenoses. EF is 20% with LVEDP 27 mmHg. Cardiac cath was performed via right femoral artery. A balloon pump was then placed to the right femoral artery sheath to provide hemodynamic support. Bedside echo shows biventricular dysfunction, severe without significant MR The patient 100% Rebreather Mask with PaO2 of 68, He Is Not Acidotic.  Emergency multivessel CABG was recommended by his cardiologist Dr. Kendra Opitz. I agreed with his recommendation and discussed emergency CABG with the patient for treatment of his severe CAD and severe LV dysfunction. He understands that emergency CABG is his best therapy for long-term survival, preservation of ventricular function, and best treatment for symptoms of coronary disease. The patient understands this  emergency operation is at high risk of major morbidity or mortality of 15-20 percent-risk of stroke, bleeding requiring blood transfusion, prolonged ventilator dependence, postop pulmonary problems including pneumonia or pleural effusion, wound infection, multisystem organ failure, and death. He understands these issues and agrees to proceed with surgery. I discussed the operation with his sister Bryce Ramirez at her home in the [place] area.   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   Zubrod Score: At the time of surgery this patient's most appropriate activity status/level should be described as: _0     0    Normal activity, no symptoms _1     1    Restricted in physical strenuous activity but ambulatory, able to do out light work _2     2    Ambulatory and capable of self care, unable to do work activities, up and about                 more than 50%  Of the time                            _3     3    Only limited self care, in bed greater than 50% of waking hours _4     4    Completely disabled, no self care, confined to bed or chair _5     5    Moribund  Past Medical History:  Diagnosis Date  . CAD (coronary artery  disease)    3 stents placed in ? vessels in [place], [place] in 2006  . Diabetes mellitus without complication ([place])   . Hypertension     Past Surgical History:  Procedure Laterality Date  . BELOW KNEE LEG AMPUTATION Right   . CARPAL TUNNEL RELEASE    . TOE AMPUTATION Left     History  Smoking Status  . Former Smoker  Smokeless Tobacco  . Former Systems developer    History  Alcohol Use  . Yes    Comment: social    Social History   Social History  . Marital status: N/A    Spouse name: N/A  . Number of children: N/A  . Years of education: N/A   Occupational History  . Not on file.   Social History Main Topics  . Smoking status: Former Research scientist (life sciences)  . Smokeless tobacco: Former Systems developer  . Alcohol use Yes      Comment: social  . Drug use: No  . Sexual activity: Not on file   Other Topics Concern  . Not on file   Social History Narrative  . No narrative on file    No Known Allergies  Current Facility-Administered Medications  Medication Dose Route Frequency Provider Last Rate Last Dose  . 0.9 %  sodium chloride infusion  10-20 mL/hr Intravenous Continuous Malvin Johns, MD 20 mL/hr at 06/16/16 1515 20 mL/hr at 06/16/16 1515  . aspirin 81 MG chewable tablet           . bisacodyl (DULCOLAX) EC tablet 5 mg  5 mg Oral Once Ivin Poot, MD      . cefUROXime (ZINACEF) 1.5 g in dextrose 5 % 50 mL IVPB  1.5 g Intravenous To OR Ivin Poot, MD      . cefUROXime (ZINACEF) 750 mg in dextrose 5 % 50 mL IVPB  750 mg Intravenous To OR Ivin Poot, MD      . chlorhexidine (PERIDEX) 0.12 % solution 15 mL  15 mL Mouth/Throat Once Ivin Poot, MD      . Chlorhexidine Gluconate Cloth 2 % PADS 6 each  6 each Topical Once Ivin Poot, MD       And  . Chlorhexidine Gluconate Cloth 2 % PADS 6 each  6 each Topical Once Ivin Poot, MD      . dexmedetomidine (PRECEDEX) 400 MCG/100ML (4 mcg/mL) infusion  0.1-0.7 mcg/kg/hr Intravenous To OR Ivin Poot, MD      . Derrill Memo ON 06/17/2016] diazepam (VALIUM) tablet 5 mg  5 mg Oral Once Ivin Poot, MD      . DOPamine (INTROPIN) 800 mg in dextrose 5 % 250 mL (3.2 mg/mL) infusion  0-10 mcg/kg/min Intravenous To OR Ivin Poot, MD      . EPINEPHrine (ADRENALIN) 4 mg in dextrose 5 % 250 mL (0.016 mg/mL) infusion  0-10 mcg/min Intravenous To OR Ivin Poot, MD      . heparin 2,500 Units, papaverine 30 mg in electrolyte-148 (PLASMALYTE-148) 500 mL irrigation   Irrigation To OR Ivin Poot, MD      . heparin 30,000 units/NS 1000 mL solution for CELLSAVER   Other To OR Ivin Poot, MD      . insulin regular (NOVOLIN R,HUMULIN R) 250 Units in sodium chloride 0.9 % 250 mL (1 Units/mL) infusion   Intravenous To OR Ivin Poot, MD      .  magnesium sulfate (IV Push/IM) injection 40 mEq  40 mEq Other To OR Ivin Poot, MD      . Derrill Memo ON 06/17/2016] metoprolol tartrate (LOPRESSOR) tablet 12.5 mg  12.5 mg Oral Once Ivin Poot, MD      . Doug Sou Hold] nitroGLYCERIN (NITROSTAT) SL tablet 0.4 mg  0.4 mg Sublingual Q5 min PRN Malvin Johns, MD   0.4 mg at 06/16/16 1509  . nitroGLYCERIN 50 mg in dextrose 5 % 250 mL (0.2 mg/mL) infusion  2-200 mcg/min Intravenous To OR Ivin Poot, MD      . phenylephrine (NEO-SYNEPHRINE) 20 mg in dextrose 5 % 250 mL (0.08 mg/mL) infusion  30-200 mcg/min Intravenous To OR Ivin Poot, MD      . potassium chloride injection 80 mEq  80 mEq Other To OR Ivin Poot, MD      . temazepam (RESTORIL) capsule 15 mg  15 mg Oral Once PRN Ivin Poot, MD      . tranexamic acid (CYKLOKAPRON) 2,500 mg in sodium chloride 0.9 % 250 mL (10 mg/mL) infusion  1.5 mg/kg/hr Intravenous To OR Ivin Poot, MD      . tranexamic acid (CYKLOKAPRON) bolus via infusion - over 30 minutes 1,258.5 mg  15 mg/kg Intravenous To OR Ivin Poot, MD      . tranexamic acid (CYKLOKAPRON) pump prime solution 168 mg  2 mg/kg Intracatheter To OR Ivin Poot, MD      . vancomycin (VANCOCIN) 1,250 mg in sodium chloride 0.9 % 250 mL IVPB  1,250 mg Intravenous To OR Ivin Poot, MD        No prescriptions prior to admission.    Family History  Problem Relation Age of Onset  . CAD Father      Review of Systems:       Cardiac Review of Systems: Y or N  Chest Pain [ y   ]  Resting SOB Blue.Reese   ] Exertional SOB  [ y ]  Orthopnea y]   Pedal Edema [  n ]    Palpitations [n  ] Syncope  [  ]   Presyncope [ nn  ]  General Review of Systems: [Y] = yes [  ]=no Constitional: recent weight change [  ]; anorexia [  ]; fatigue [  ]; nausea [  ]; night sweats [  ]; fever [  ]; or chills [  ]                                                               Dental: poor dentition[  ]; Last Dentist visit: 3 mos  Eye : blurred  vision [  ]; diplopia [   ]; vision changes [  ];  Amaurosis fugax[  ]; Resp: cough [ y ];  wheezing[  ];  hemoptysis[  ]; shortness of breath[y  ]; paroxysmal nocturnal dyspnea[  ]; dyspnea on exertion[ y ]; or orthopnea[ y ];  GI:  gallstones[  ], vomiting[  ];  dysphagia[  ]; melena[  ];  hematochezia [  ]; heartburn[  ];   Hx of  Colonoscopy[  ]; GU: kidney stones [  ]; hematuria[  ];   dysuria [  ];  nocturia[  ];  history of     obstruction [  ];  urinary frequency [  ]             Skin: rash, swelling[  ];, hair loss[ y ];  peripheral edema[  ];  or itching[  ]; Musculosketetal: myalgias[  ];  joint swelling[  ];  joint erythema[  ];  joint pain[  ];  back pain[y  ];  Heme/Lymph: bruising[  ];  bleeding[  ];  anemia[  ];  Neuro: TIA[  ];  headaches[  ];  stroke[  ];  vertigo[  ];  seizures[  ];   paresthesias[  ];  difficulty walking[  ];  Psych:depression[  ]; anxiety[  ];  Endocrine: diabetes[y uses insulin pump  ];  thyroid dysfunction[  ];  Immunizations: Flu [  ]; Pneumococcal[  ];  Other: Right-hand dominant  Physical Exam: BP (!) 133/93   Pulse 78   Temp 99.4 F (37.4 C) (Oral)   Resp (!) 40   Ht _0  (1.778 m)   Wt 185 lb (83.9 kg)   SpO2 (!) 86%   BMI 26.54 kg/m       Physical Exam  General: Middle-aged well-nourished Caucasian male acute distress in the cardiac cath Ramirez on oxygen mask HEENT: Normocephalic pupils equal , dentition adequate Neck: Supple without JVD, adenopathy, or bruit Chest: Clear to auscultation, symmetrical breath sounds, no rhonchi, no tenderness             or deformity Cardiovascular: Regular rate and rhythm, no murmur, no gallop, peripheral pulses nonpalpable in lower extremities              Abdomen:  Soft, nontender, no palpable mass or organomegaly Extremities: Warm, well-perfused, no clubbing cyanosis edema or tenderness, well-healed right leg stump right BKA. Amputation of left great toe well-healed.              no venous stasis  changes of the legs Rectal/GU: Deferred Neuro: Grossly non--focal and symmetrical throughout Skin: Clean and dry without rash or ulceration    Diagnostic Studies & Laboratory data:     Recent Radiology Findings:   Dg Chest Port 1 View  Result Date: 06/16/2016 CLINICAL DATA:  Flu like symptoms. EXAM: PORTABLE CHEST 1 VIEW COMPARISON:  None. FINDINGS: No pneumothorax. The heart size borderline. The hila and mediastinum are unremarkable. Interlobular septal thickening is seen peripherally suggesting edema. However, there are more focal opacities in the lung bases. IMPRESSION: Findings suggest edema with interlobular septal thickening. However, the more focal opacities in the bases, left greater than right, are concerning for multifocal pneumonia given history. Electronically Signed   By: Dorise Bullion III M.D   On: 06/16/2016 15:18     I have independently reviewed the above radiologic studies.  Recent Ramirez Findings: Ramirez Results  Component Value Date   WBC 13.4 (H) 06/16/2016   HGB 14.3 06/16/2016   HCT 42.0 06/16/2016   PLT 281 06/16/2016   GLUCOSE 173 (H) 06/16/2016   ALT 37 06/16/2016   AST 50 (H) 06/16/2016   NA 140 06/16/2016   K 4.0 06/16/2016   CL 98 (L) 06/16/2016   CREATININE 0.90 06/16/2016   BUN 23 (H) 06/16/2016   CO2 29 06/16/2016   CO2 28 06/16/2016   INR 0.94 06/16/2016      Assessment / Plan:      Acute MI with left main stenosis high-grade coronary disease severe LV dysfunction moderate RV dysfunction Cardiogenic shock with preop balloon pump Type 1 diabetes mellitus History of right BKA Possible  right lower lobe pneumonitis x-ray  The patient agrees to proceed with emergency CABG. Patient agrees to receive blood transfusion therapy as needed. He understands this is a high-risk operation since he is having an MI, since he has pre-existing severe LV dysfunction, and since he is on Plavix and at increased risk of bleeding and major transfusion  requirement   _0 @ 06/16/2016 5:38 PM

## 2016-06-16 NOTE — ED Notes (Signed)
PER CARELINK, THIS IS AN EMERGENCY SITUATION AND THAT IS MORE IMPORTANT THAN THE PT SIGNING CONSENT TO BE TRANSFERRED TO THE CATH LAB.

## 2016-06-16 NOTE — ED Notes (Addendum)
Patient c/o chest pain to this RN.  Patient states that has had flu like symptoms, productive cough x4 days ago.  Chest pain began x3 days ago after eating some Orzo.  No complains of SOB.  Patient is on 3L of O2.  Per EMS patient was 87 on RA from home, put on 3L of O2 and O2 sats are 92%.  Patient states that chest feels like if is burning and aching on the right side.  Patient on Monitor, ambulatory, A&Ox4.  NAD at this time.

## 2016-06-16 NOTE — Progress Notes (Signed)
  Echocardiogram Echocardiogram Transesophageal has been performed.  Janalyn HarderWest, Travelle Mcclimans R 06/16/2016, 6:51 PM

## 2016-06-16 NOTE — Progress Notes (Signed)
  Echocardiogram 2D Echocardiogram has been performed.  Janalyn HarderWest, Laron Angelini R 06/16/2016, 5:33 PM

## 2016-06-16 NOTE — ED Provider Notes (Addendum)
[facility]-EMERGENCY DEPT Provider Note   CSN: [id number] Arrival date & time: 06/16/16  1405     History   Chief Complaint Chief Complaint  Patient presents with  . Chest Pain  . flu like symptoms    HPI Bryce Ramirez is a 61 y.o. male.  Patient is a 61 year old male with a history of diabetes, hypertension and coronary artery disease status post stent placement in 2006. The stents were placed in [place]. He reports chest pain for about the last 4-5 days. It's been intermittent. Describes as a burning sensation in the center of his chest. It's nonradiating. He has associated shortness of breath. He states it's worse at night, particularly when he is lying down. He has also a cough which is productive of grey/green sputum. He's had some subjective fevers. No nausea or diaphoresis. No leg swelling. He is status post a right BKA.      Past Medical History:  Diagnosis Date  . Diabetes mellitus without complication (HCC)   . Hypertension     There are no active problems to display for this patient.   Past Surgical History:  Procedure Laterality Date  . BELOW KNEE LEG AMPUTATION         Home Medications    Prior to Admission medications   Not on File    Family History No family history on file.  Social History Social History  Substance Use Topics  . Smoking status: Former Games developer  . Smokeless tobacco: Former Neurosurgeon  . Alcohol use Yes     Comment: social     Allergies   Patient has no known allergies.   Review of Systems Review of Systems  Constitutional: Positive for fatigue. Negative for chills, diaphoresis and fever.  HENT: Negative for congestion, rhinorrhea and sneezing.   Eyes: Negative.   Respiratory: Positive for cough, chest tightness and shortness of breath.   Cardiovascular: Positive for chest pain. Negative for leg swelling.  Gastrointestinal: Negative for abdominal pain, blood in stool, diarrhea, nausea and vomiting.  Genitourinary: Negative for  difficulty urinating, flank pain, frequency and hematuria.  Musculoskeletal: Negative for arthralgias and back pain.  Skin: Negative for rash.  Neurological: Negative for dizziness, speech difficulty, weakness, numbness and headaches.     Physical Exam Updated Vital Signs BP 116/66 (BP Location: Left Arm)   Pulse 83   Temp 99.4 F (37.4 C) (Oral)   Resp 14   Ht 5\' 10"  (1.778 m)   Wt 185 lb (83.9 kg)   SpO2 91%   BMI 26.54 kg/m   Physical Exam  Constitutional: He is oriented to person, place, and time. He appears well-developed and well-nourished.  HENT:  Head: Normocephalic and atraumatic.  Eyes: Pupils are equal, round, and reactive to light.  Neck: Normal range of motion. Neck supple.  Cardiovascular: Normal rate, regular rhythm and normal heart sounds.   Pulmonary/Chest: Effort normal and breath sounds normal. No respiratory distress. He has no wheezes. He has no rales. He exhibits no tenderness.  Rhonchi bilaterally  Abdominal: Soft. Bowel sounds are normal. There is no tenderness. There is no rebound and no guarding.  Musculoskeletal: Normal range of motion. He exhibits no edema.  Right BKA  Lymphadenopathy:    He has no cervical adenopathy.  Neurological: He is alert and oriented to person, place, and time.  Skin: Skin is warm and dry. No rash noted.  Psychiatric: He has a normal mood and affect.     ED Treatments / Results  Labs (all  labs ordered are listed, but only abnormal results are displayed) Labs Reviewed  CBC - Abnormal; Notable for the following:       Result Value   WBC 13.4 (*)    All other components within normal limits  DIFFERENTIAL - Abnormal; Notable for the following:    Neutro Abs 10.5 (*)    Monocytes Absolute 1.4 (*)    All other components within normal limits  BASIC METABOLIC PANEL  PROTIME-INR  APTT  COMPREHENSIVE METABOLIC PANEL  TROPONIN I  LIPID PANEL  I-STAT TROPOININ, ED    EKG  EKG Interpretation  Date/Time:  Sunday  June 16 2016 14:39:55 EST Ventricular Rate:  83 PR Interval:    QRS Duration: 89 QT Interval:  380 QTC Calculation: 447 R Axis:   23 Text Interpretation:  Sinus rhythm Repol abnrm, severe global ischemia (LM/MVD) ** ** ACUTE MI / STEMI ** ** No old tracing to compare Reconfirmed by Dejae Bernet  MD, Lillith Mcneff [phone]) on 06/16/2016 3:19:29 PM       Radiology Dg Chest Port 1 View  Result Date: 06/16/2016 CLINICAL DATA:  Flu like symptoms. EXAM: PORTABLE CHEST 1 VIEW COMPARISON:  None. FINDINGS: No pneumothorax. The heart size borderline. The hila and mediastinum are unremarkable. Interlobular septal thickening is seen peripherally suggesting edema. However, there are more focal opacities in the lung bases. IMPRESSION: Findings suggest edema with interlobular septal thickening. However, the more focal opacities in the bases, left greater than right, are concerning for multifocal pneumonia given history. Electronically Signed   By: Gerome Sam III M.D   On: 06/16/2016 15:18    Procedures Procedures (including critical care time)  Medications Ordered in ED Medications  0.9 %  sodium chloride infusion (20 mL/hr Intravenous New Bag/Given 06/16/16 1515)  nitroGLYCERIN (NITROSTAT) SL tablet 0.4 mg (0.4 mg Sublingual Given 06/16/16 1509)  aspirin 81 MG chewable tablet (not administered)  aspirin chewable tablet 324 mg (324 mg Oral Given 06/16/16 1500)  heparin injection 4,000 Units (4,000 Units Intravenous Given 06/16/16 1509)     Initial Impression / Assessment and Plan / ED Course  I have reviewed the triage vital signs and the nursing notes.  Pertinent labs & imaging results that were available during my care of the patient were reviewed by me and considered in my medical decision making (see chart for details).  Clinical Course     Patient presents with chest pain cough and shortness of breath. Initial EKG is concerning for acute ischemia. A Code STEMI was activated. Patient is given aspirin,  nitroglycerin and heparin bolus 4000 units. He was noted to be mildly hypoxic on arrival was placed on supplemental oxygen. A portable chest was done given that he has symptoms concerning for pneumonia. There is no suggestions of sepsis. I spoke with Dr.McAlhany with a cardiologist to his accepting the patient.  I did notify him that the pt does have symptoms concerning for pneumonia as well  I also notified the EDP, Dr. Denton Lank.  Of note, pt does have an insulin pump which was he will turn off for now.  PT transferred to [facility]  CRITICAL CARE Performed by: Isela Stantz Total critical care time: 30 minutes Critical care time was exclusive of separately billable procedures and treating other patients. Critical care was necessary to treat or prevent imminent or life-threatening deterioration. Critical care was time spent personally by me on the following activities: development of treatment plan with patient and/or surrogate as well as nursing, discussions with consultants,  evaluation of patient's response to treatment, examination of patient, obtaining history from patient or surrogate, ordering and performing treatments and interventions, ordering and review of laboratory studies, ordering and review of radiographic studies, pulse oximetry and re-evaluation of patient's condition.   Final Clinical Impressions(s) / ED Diagnoses   Final diagnoses:  ST elevation myocardial infarction (STEMI), unspecified artery [facility](HCC)    New Prescriptions New Prescriptions   No medications on file     Rolan BuccoMelanie Kasidee Voisin, MD 06/16/16 1534    Rolan BuccoMelanie Mckenzy Salazar, MD 06/16/16 1535

## 2016-06-16 NOTE — Anesthesia Preprocedure Evaluation (Addendum)
Anesthesia Evaluation  Patient identified by MRN, date of birth, ID band Patient awake    Reviewed: Allergy & Precautions, NPO status , Patient's Chart, lab work & pertinent test results, reviewed documented beta blocker date and time Preop documentation limited or incomplete due to emergent nature of procedure.  History of Anesthesia Complications Negative for: history of anesthetic complications  Airway Mallampati: III  TM Distance: >3 FB Neck ROM: Limited   Comment: Short neck Dental  (+) Chipped, Poor Dentition, Missing, Dental Advisory Given   Pulmonary pneumonia (early Bilat infiltrates), unresolved, Recent URI , former smoker,     + decreased breath sounds (decreased on Right)      Cardiovascular hypertension, Pt. on medications and Pt. on home beta blockers + angina (no CP with IABP in place) at rest + CAD, + Past MI, + Cardiac Stents and + Peripheral Vascular Disease   Rhythm:Regular Rate:Normal  05/16/17 Cardiac cath: severe distal left main disease, severe proximal RCA stenosis, severe ostial LAD stenosis, severe ostial Circumflex stenosis, severe OM2 stenosis within old stent. LVEF 20-25% by LV gram.   Neuro/Psych negative neurological ROS     GI/Hepatic GERD  Poorly Controlled,(+)     substance abuse (pt reports sobriety now)  alcohol use,   Endo/Other  diabetes (glu 173, insulin pump), Insulin Dependent  Renal/GU negative Renal ROS     Musculoskeletal   Abdominal   Peds  Hematology  (+) Blood dyscrasia (plavix), ,   Anesthesia Other Findings   Reproductive/Obstetrics                            Anesthesia Physical Anesthesia Plan  ASA: IV and emergent  Anesthesia Plan: General   Post-op Pain Management:    Induction: Intravenous  Airway Management Planned: Oral ETT and Video Laryngoscope Planned  Additional Equipment: Arterial line, CVP, PA Cath, TEE and Ultrasound  Guidance Line Placement  Intra-op Plan:   Post-operative Plan: Post-operative intubation/ventilation  Informed Consent: I have reviewed the patients History and Physical, chart, labs and discussed the procedure including the risks, benefits and alternatives for the proposed anesthesia with the patient or authorized representative who has indicated his/her understanding and acceptance.   Dental advisory given and Only emergency history available  Plan Discussed with: Surgeon, CRNA and Anesthesiologist  Anesthesia Plan Comments: (Plan routine monitors, A-line, PA cath, GETA with TEE, post op ventilation)       Anesthesia Quick Evaluation

## 2016-06-17 ENCOUNTER — Inpatient Hospital Stay (HOSPITAL_COMMUNITY): Payer: BLUE CROSS/BLUE SHIELD

## 2016-06-17 ENCOUNTER — Encounter (HOSPITAL_COMMUNITY): Payer: Self-pay | Admitting: Cardiovascular Disease

## 2016-06-17 LAB — PREPARE PLATELET PHERESIS
Unit division: 0
Unit division: 0

## 2016-06-17 LAB — CBC
HCT: 35.2 % — ABNORMAL LOW (ref 39.0–52.0)
HCT: 33.4 % — ABNORMAL LOW (ref 39.0–52.0)
HEMATOCRIT: 35.7 % — AB (ref 39.0–52.0)
HEMOGLOBIN: 11.3 g/dL — AB (ref 13.0–17.0)
HEMOGLOBIN: 11.8 g/dL — AB (ref 13.0–17.0)
HEMOGLOBIN: 12 g/dL — AB (ref 13.0–17.0)
MCH: 29.2 pg (ref 26.0–34.0)
MCH: 29.3 pg (ref 26.0–34.0)
MCH: 29.5 pg (ref 26.0–34.0)
MCHC: 33.6 g/dL (ref 30.0–36.0)
MCHC: 33.5 g/dL (ref 30.0–36.0)
MCHC: 33.8 g/dL (ref 30.0–36.0)
MCV: 86.5 fL (ref 78.0–100.0)
MCV: 87.1 fL (ref 78.0–100.0)
MCV: 87.7 fL (ref 78.0–100.0)
Platelets: 285 10*3/uL (ref 150–400)
Platelets: 276 10*3/uL (ref 150–400)
Platelets: 243 10*3/uL (ref 150–400)
RBC: 3.86 MIL/uL — AB (ref 4.22–5.81)
RBC: 4.04 MIL/uL — AB (ref 4.22–5.81)
RBC: 4.07 MIL/uL — ABNORMAL LOW (ref 4.22–5.81)
RDW: 13.5 % (ref 11.5–15.5)
RDW: 13.2 % (ref 11.5–15.5)
RDW: 13.3 % (ref 11.5–15.5)
WBC: 14.4 10*3/uL — AB (ref 4.0–10.5)
WBC: 15.9 10*3/uL — ABNORMAL HIGH (ref 4.0–10.5)
WBC: 16.7 10*3/uL — AB (ref 4.0–10.5)

## 2016-06-17 LAB — INFLUENZA PANEL BY PCR (TYPE A & B)
Influenza A By PCR: NEGATIVE
Influenza B By PCR: NEGATIVE

## 2016-06-17 LAB — BASIC METABOLIC PANEL
Anion gap: 6 (ref 5–15)
BUN: 17 mg/dL (ref 6–20)
CHLORIDE: 105 mmol/L (ref 101–111)
CO2: 27 mmol/L (ref 22–32)
Calcium: 7.5 mg/dL — ABNORMAL LOW (ref 8.9–10.3)
Creatinine, Ser: 0.93 mg/dL (ref 0.61–1.24)
GFR calc Af Amer: >60 mL/min (ref >60)
GFR calc non Af Amer: >60 mL/min (ref >60)
GLUCOSE: 166 mg/dL — AB (ref 65–99)
POTASSIUM: 4.1 mmol/L (ref 3.5–5.1)
Sodium: 138 mmol/L (ref 135–145)

## 2016-06-17 LAB — POCT I-STAT, CHEM 8
BUN: 18 mg/dL (ref 6–20)
BUN: 17 mg/dL (ref 6–20)
CALCIUM ION: 1.08 mmol/L — AB (ref 1.15–1.40)
CREATININE: 0.9 mg/dL (ref 0.61–1.24)
CREATININE: 0.9 mg/dL (ref 0.61–1.24)
Calcium, Ion: 1.06 mmol/L — ABNORMAL LOW (ref 1.15–1.40)
Chloride: 102 mmol/L (ref 101–111)
Chloride: 102 mmol/L (ref 101–111)
GLUCOSE: 145 mg/dL — AB (ref 65–99)
Glucose, Bld: 178 mg/dL — ABNORMAL HIGH (ref 65–99)
HCT: 34 % — ABNORMAL LOW (ref 39.0–52.0)
HCT: 33 % — ABNORMAL LOW (ref 39.0–52.0)
HEMOGLOBIN: 11.2 g/dL — AB (ref 13.0–17.0)
Hemoglobin: 11.6 g/dL — ABNORMAL LOW (ref 13.0–17.0)
Potassium: 4.1 mmol/L (ref 3.5–5.1)
Potassium: 4 mmol/L (ref 3.5–5.1)
SODIUM: 139 mmol/L (ref 135–145)
Sodium: 140 mmol/L (ref 135–145)
TCO2: 24 mmol/L (ref 0–100)
TCO2: 27 mmol/L (ref 0–100)

## 2016-06-17 LAB — POCT I-STAT 3, ART BLOOD GAS (G3+)
ACID-BASE EXCESS: 2 mmol/L (ref 0.0–2.0)
ACID-BASE EXCESS: 2 mmol/L (ref 0.0–2.0)
ACID-BASE EXCESS: 3 mmol/L — AB (ref 0.0–2.0)
BICARBONATE: 26.3 mmol/L (ref 20.0–28.0)
BICARBONATE: 26.5 mmol/L (ref 20.0–28.0)
BICARBONATE: 26.6 mmol/L (ref 20.0–28.0)
O2 SAT: 100 %
O2 Saturation: 95 %
O2 Saturation: 97 %
TCO2: 27 mmol/L (ref 0–100)
TCO2: 28 mmol/L (ref 0–100)
TCO2: 28 mmol/L (ref 0–100)
pCO2 arterial: 35.9 mmHg (ref 32.0–48.0)
pCO2 arterial: 41.1 mmHg (ref 32.0–48.0)
pCO2 arterial: 42.7 mmHg (ref 32.0–48.0)
pH, Arterial: 7.403 (ref 7.350–7.450)
pH, Arterial: 7.422 (ref 7.350–7.450)
pH, Arterial: 7.472 — ABNORMAL HIGH (ref 7.350–7.450)
pO2, Arterial: 223 mmHg — ABNORMAL HIGH (ref 83.0–108.0)
pO2, Arterial: 71 mmHg — ABNORMAL LOW (ref 83.0–108.0)
pO2, Arterial: 91 mmHg (ref 83.0–108.0)

## 2016-06-17 LAB — GLUCOSE, CAPILLARY
GLUCOSE-CAPILLARY: 140 mg/dL — AB (ref 65–99)
GLUCOSE-CAPILLARY: 147 mg/dL — AB (ref 65–99)
GLUCOSE-CAPILLARY: 150 mg/dL — AB (ref 65–99)
GLUCOSE-CAPILLARY: 155 mg/dL — AB (ref 65–99)
GLUCOSE-CAPILLARY: 165 mg/dL — AB (ref 65–99)
GLUCOSE-CAPILLARY: 167 mg/dL — AB (ref 65–99)
GLUCOSE-CAPILLARY: 174 mg/dL — AB (ref 65–99)
GLUCOSE-CAPILLARY: 175 mg/dL — AB (ref 65–99)
Glucose-Capillary: 159 mg/dL — ABNORMAL HIGH (ref 65–99)
Glucose-Capillary: 160 mg/dL — ABNORMAL HIGH (ref 65–99)
Glucose-Capillary: 176 mg/dL — ABNORMAL HIGH (ref 65–99)
Glucose-Capillary: 167 mg/dL — ABNORMAL HIGH (ref 65–99)
Glucose-Capillary: 191 mg/dL — ABNORMAL HIGH (ref 65–99)
Glucose-Capillary: 202 mg/dL — ABNORMAL HIGH (ref 65–99)
Glucose-Capillary: 147 mg/dL — ABNORMAL HIGH (ref 65–99)
Glucose-Capillary: 142 mg/dL — ABNORMAL HIGH (ref 65–99)
Glucose-Capillary: 201 mg/dL — ABNORMAL HIGH (ref 65–99)
Glucose-Capillary: 155 mg/dL — ABNORMAL HIGH (ref 65–99)
Glucose-Capillary: 183 mg/dL — ABNORMAL HIGH (ref 65–99)
Glucose-Capillary: 136 mg/dL — ABNORMAL HIGH (ref 65–99)

## 2016-06-17 LAB — POCT I-STAT 4, (NA,K, GLUC, HGB,HCT)
GLUCOSE: 179 mg/dL — AB (ref 65–99)
HCT: 35 % — ABNORMAL LOW (ref 39.0–52.0)
Hemoglobin: 11.9 g/dL — ABNORMAL LOW (ref 13.0–17.0)
Potassium: 3.6 mmol/L (ref 3.5–5.1)
Sodium: 139 mmol/L (ref 135–145)

## 2016-06-17 LAB — MAGNESIUM
MAGNESIUM: 3 mg/dL — AB (ref 1.7–2.4)
Magnesium: 2.3 mg/dL (ref 1.7–2.4)

## 2016-06-17 LAB — PROTIME-INR
INR: 1.25
PROTHROMBIN TIME: 15.8 s — AB (ref 11.4–15.2)

## 2016-06-17 LAB — CREATININE, SERUM: CREATININE: 0.98 mg/dL (ref 0.61–1.24)

## 2016-06-17 LAB — SURGICAL PCR SCREEN
MRSA, PCR: NEGATIVE
Staphylococcus aureus: NEGATIVE

## 2016-06-17 LAB — ECHOCARDIOGRAM LIMITED
Ao-asc: 30 cm
FS: 11 % — AB (ref 28–44)
Height: 70 in
IV/PV OW: 1
LA ID, A-P, ES: 33 mm
LA diam index: 1.63 cm/m2
LEFT ATRIUM END SYS DIAM: 33 mm
LV PW d: 10 mm — AB (ref 0.6–1.1)
WEIGHTICAEL: 2960 [oz_av]

## 2016-06-17 LAB — COOXEMETRY PANEL
Carboxyhemoglobin: 1 % (ref 0.5–1.5)
Methemoglobin: 1 % (ref 0.0–1.5)
O2 Saturation: 65.2 %
Total hemoglobin: 11.9 g/dL — ABNORMAL LOW (ref 12.0–16.0)

## 2016-06-17 LAB — APTT: APTT: 34 s (ref 24–36)

## 2016-06-17 MED ORDER — SODIUM CHLORIDE 0.9 % IV SOLN
30.0000 meq | Freq: Once | INTRAVENOUS | Status: AC
  Administered 2016-06-17: 30 meq via INTRAVENOUS
  Filled 2016-06-17 (×2): qty 15

## 2016-06-17 MED ORDER — ALBUMIN HUMAN 5 % IV SOLN
250.0000 mL | INTRAVENOUS | Status: AC | PRN
  Administered 2016-06-17 (×2): 250 mL via INTRAVENOUS

## 2016-06-17 MED ORDER — LACTATED RINGERS IV SOLN: 500.0000 mL | Freq: Once | INTRAVENOUS | Status: DC | PRN

## 2016-06-17 MED ORDER — SODIUM CHLORIDE 0.9 % IV SOLN
250.0000 mL | INTRAVENOUS | Status: DC
  Administered 2016-06-17: 250 mL via INTRAVENOUS

## 2016-06-17 MED ORDER — DEXTROSE 5 % IV SOLN
0.0000 ug/min | INTRAVENOUS | Status: DC
  Administered 2016-06-17: 2 ug/min via INTRAVENOUS
  Filled 2016-06-17 (×2): qty 4

## 2016-06-17 MED ORDER — CHLORHEXIDINE GLUCONATE 0.12 % MT SOLN
15.0000 mL | Freq: Two times a day (BID) | OROMUCOSAL | Status: DC
  Administered 2016-06-17: 15 mL via OROMUCOSAL

## 2016-06-17 MED ORDER — VANCOMYCIN HCL IN DEXTROSE 1-5 GM/200ML-% IV SOLN
1000.0000 mg | Freq: Once | INTRAVENOUS | Status: AC
Start: 2016-06-17 — End: 2016-06-17
  Administered 2016-06-17: 1000 mg via INTRAVENOUS
  Filled 2016-06-17: qty 200

## 2016-06-17 MED ORDER — BISACODYL 5 MG PO TBEC
10.0000 mg | DELAYED_RELEASE_TABLET | Freq: Every day | ORAL | Status: DC
  Administered 2016-06-18 – 2016-06-25 (×5): 10 mg via ORAL
  Filled 2016-06-17 (×6): qty 2

## 2016-06-17 MED ORDER — INSULIN REGULAR BOLUS VIA INFUSION
0.0000 [IU] | Freq: Three times a day (TID) | INTRAVENOUS | Status: DC
  Filled 2016-06-17: qty 10

## 2016-06-17 MED ORDER — SODIUM CHLORIDE 0.9 % IV SOLN
INTRAVENOUS | Status: DC
  Administered 2016-06-17: 10 mL/h via INTRAVENOUS
  Administered 2016-06-27: 01:00:00 via INTRAVENOUS

## 2016-06-17 MED ORDER — SODIUM CHLORIDE 0.9 % IV SOLN
30.0000 meq | Freq: Once | INTRAVENOUS | Status: AC
  Filled 2016-06-17: qty 15

## 2016-06-17 MED ORDER — FUROSEMIDE 10 MG/ML IJ SOLN
20.0000 mg | Freq: Two times a day (BID) | INTRAMUSCULAR | Status: DC
  Administered 2016-06-17 – 2016-06-19 (×5): 20 mg via INTRAVENOUS
  Filled 2016-06-17 (×4): qty 2

## 2016-06-17 MED ORDER — TRAMADOL HCL 50 MG PO TABS
50.0000 mg | ORAL_TABLET | ORAL | Status: DC | PRN
  Administered 2016-06-18 – 2016-06-19 (×3): 100 mg via ORAL
  Filled 2016-06-17 (×4): qty 2

## 2016-06-17 MED ORDER — METOPROLOL TARTRATE 12.5 MG HALF TABLET
12.5000 mg | ORAL_TABLET | Freq: Two times a day (BID) | ORAL | Status: DC
  Administered 2016-06-18 – 2016-06-27 (×17): 12.5 mg via ORAL
  Filled 2016-06-17 (×19): qty 1

## 2016-06-17 MED ORDER — VASOPRESSIN 20 UNIT/ML IV SOLN
0.0300 [IU]/min | INTRAVENOUS | Status: DC
  Filled 2016-06-17: qty 2

## 2016-06-17 MED ORDER — ASPIRIN EC 325 MG PO TBEC
325.0000 mg | DELAYED_RELEASE_TABLET | Freq: Every day | ORAL | Status: DC
  Administered 2016-06-20 – 2016-06-27 (×8): 325 mg via ORAL
  Filled 2016-06-17 (×9): qty 1

## 2016-06-17 MED ORDER — DEXMEDETOMIDINE HCL IN NACL 400 MCG/100ML IV SOLN
0.4000 ug/kg/h | INTRAVENOUS | Status: DC
  Administered 2016-06-17 – 2016-06-18 (×5): 1.2 ug/kg/h via INTRAVENOUS
  Administered 2016-06-18: 1 ug/kg/h via INTRAVENOUS
  Filled 2016-06-17 (×6): qty 100

## 2016-06-17 MED ORDER — MAGNESIUM SULFATE 4 GM/100ML IV SOLN
INTRAVENOUS | Status: AC
  Filled 2016-06-17: qty 100

## 2016-06-17 MED ORDER — MILRINONE LACTATE IN DEXTROSE 20-5 MG/100ML-% IV SOLN
0.3000 ug/kg/min | INTRAVENOUS | Status: DC
  Administered 2016-06-17 – 2016-06-18 (×5): 0.3 ug/kg/min via INTRAVENOUS
  Filled 2016-06-17 (×5): qty 100

## 2016-06-17 MED ORDER — MORPHINE SULFATE (PF) 4 MG/ML IV SOLN
INTRAVENOUS | Status: AC
  Administered 2016-06-17: 4 mg
  Filled 2016-06-17: qty 1

## 2016-06-17 MED ORDER — MIDAZOLAM HCL 2 MG/2ML IJ SOLN
2.0000 mg | INTRAMUSCULAR | Status: DC | PRN
  Administered 2016-06-17 – 2016-06-18 (×13): 2 mg via INTRAVENOUS
  Filled 2016-06-17 (×12): qty 2

## 2016-06-17 MED ORDER — MORPHINE SULFATE (PF) 2 MG/ML IV SOLN
1.0000 mg | INTRAVENOUS | Status: AC | PRN
  Administered 2016-06-17 (×2): 4 mg via INTRAVENOUS
  Filled 2016-06-17 (×2): qty 2

## 2016-06-17 MED ORDER — ASPIRIN 81 MG PO CHEW
324.0000 mg | CHEWABLE_TABLET | Freq: Every day | ORAL | Status: DC
  Administered 2016-06-17 – 2016-06-18 (×2): 324 mg
  Filled 2016-06-17 (×2): qty 4

## 2016-06-17 MED ORDER — LACTATED RINGERS IV SOLN
INTRAVENOUS | Status: DC
Start: 2016-06-17 — End: 2016-06-20
  Administered 2016-06-17: 20 mL/h via INTRAVENOUS

## 2016-06-17 MED ORDER — LIDOCAINE HCL (PF) 2 % IJ SOLN
100.0000 mg | Freq: Once | INTRAMUSCULAR | Status: AC
Start: 2016-06-17 — End: 2016-06-17
  Administered 2016-06-17: 100 mg

## 2016-06-17 MED ORDER — ORAL CARE MOUTH RINSE
15.0000 mL | Freq: Two times a day (BID) | OROMUCOSAL | Status: DC
  Administered 2016-06-17 (×2): 15 mL via OROMUCOSAL

## 2016-06-17 MED ORDER — MIDAZOLAM HCL 2 MG/2ML IJ SOLN
INTRAMUSCULAR | Status: AC
  Filled 2016-06-17: qty 2

## 2016-06-17 MED ORDER — FAMOTIDINE IN NACL 20-0.9 MG/50ML-% IV SOLN
20.0000 mg | Freq: Two times a day (BID) | INTRAVENOUS | Status: DC
  Administered 2016-06-17 – 2016-06-19 (×5): 20 mg via INTRAVENOUS
  Filled 2016-06-17 (×5): qty 50

## 2016-06-17 MED ORDER — OXYCODONE HCL 5 MG PO TABS
5.0000 mg | ORAL_TABLET | ORAL | Status: DC | PRN
  Administered 2016-06-18: 10 mg via ORAL
  Administered 2016-06-18: 5 mg via ORAL
  Administered 2016-06-19: 10 mg via ORAL
  Administered 2016-06-19: 5 mg via ORAL
  Filled 2016-06-17: qty 1
  Filled 2016-06-17 (×2): qty 2
  Filled 2016-06-17: qty 1
  Filled 2016-06-17: qty 2
  Filled 2016-06-17: qty 1

## 2016-06-17 MED ORDER — LACTATED RINGERS IV SOLN: INTRAVENOUS | Status: DC

## 2016-06-17 MED ORDER — ONDANSETRON HCL 4 MG/2ML IJ SOLN
4.0000 mg | Freq: Four times a day (QID) | INTRAMUSCULAR | Status: DC | PRN
  Administered 2016-06-18 – 2016-06-19 (×2): 4 mg via INTRAVENOUS
  Filled 2016-06-17 (×2): qty 2

## 2016-06-17 MED ORDER — SODIUM CHLORIDE 0.9 % IV SOLN
INTRAVENOUS | Status: DC
  Administered 2016-06-17: 4.8 [IU]/h via INTRAVENOUS
  Administered 2016-06-18: 3.6 [IU]/h via INTRAVENOUS
  Filled 2016-06-17 (×3): qty 2.5

## 2016-06-17 MED ORDER — ACETAMINOPHEN 650 MG RE SUPP: 650.0000 mg | Freq: Once | RECTAL | Status: AC

## 2016-06-17 MED ORDER — SODIUM CHLORIDE 0.9% FLUSH
3.0000 mL | Freq: Two times a day (BID) | INTRAVENOUS | Status: DC
  Administered 2016-06-17: 3 mL via INTRAVENOUS
  Administered 2016-06-17: 40 mL via INTRAVENOUS
  Administered 2016-06-18 – 2016-06-23 (×8): 3 mL via INTRAVENOUS

## 2016-06-17 MED ORDER — DEXMEDETOMIDINE HCL IN NACL 200 MCG/50ML IV SOLN
0.4000 ug/kg/h | INTRAVENOUS | Status: DC
  Administered 2016-06-17: 0.7 ug/kg/h via INTRAVENOUS
  Administered 2016-06-17: 0.6 ug/kg/h via INTRAVENOUS
  Filled 2016-06-17 (×2): qty 50

## 2016-06-17 MED ORDER — SODIUM CHLORIDE 0.45 % IV SOLN
INTRAVENOUS | Status: DC | PRN
  Administered 2016-06-17: 20 mL/h via INTRAVENOUS

## 2016-06-17 MED ORDER — METOCLOPRAMIDE HCL 5 MG/ML IJ SOLN
10.0000 mg | Freq: Four times a day (QID) | INTRAMUSCULAR | Status: AC
  Administered 2016-06-17 – 2016-06-21 (×19): 10 mg via INTRAVENOUS
  Filled 2016-06-17 (×20): qty 2

## 2016-06-17 MED ORDER — DEXTROSE 5 % IV SOLN
0.0000 ug/min | INTRAVENOUS | Status: DC
  Administered 2016-06-17 (×2): 3 ug/min via INTRAVENOUS
  Filled 2016-06-17 (×3): qty 4

## 2016-06-17 MED ORDER — MORPHINE SULFATE (PF) 2 MG/ML IV SOLN
2.0000 mg | INTRAVENOUS | Status: DC | PRN
  Administered 2016-06-17 (×7): 4 mg via INTRAVENOUS
  Administered 2016-06-18: 2 mg via INTRAVENOUS
  Administered 2016-06-18 (×4): 4 mg via INTRAVENOUS
  Administered 2016-06-18 – 2016-06-19 (×6): 2 mg via INTRAVENOUS
  Filled 2016-06-17 (×3): qty 2
  Filled 2016-06-17: qty 1
  Filled 2016-06-17 (×4): qty 2
  Filled 2016-06-17: qty 1
  Filled 2016-06-17 (×3): qty 2
  Filled 2016-06-17 (×2): qty 1
  Filled 2016-06-17 (×2): qty 2
  Filled 2016-06-17 (×3): qty 1
  Filled 2016-06-17 (×3): qty 2

## 2016-06-17 MED ORDER — ACETAMINOPHEN 160 MG/5ML PO SOLN
650.0000 mg | Freq: Once | ORAL | Status: AC
  Administered 2016-06-17: 650 mg

## 2016-06-17 MED ORDER — DOCUSATE SODIUM 100 MG PO CAPS
200.0000 mg | ORAL_CAPSULE | Freq: Every day | ORAL | Status: DC
  Administered 2016-06-18 – 2016-06-25 (×5): 200 mg via ORAL
  Filled 2016-06-17 (×6): qty 2

## 2016-06-17 MED ORDER — CHLORHEXIDINE GLUCONATE 0.12% ORAL RINSE (MEDLINE KIT)
15.0000 mL | Freq: Two times a day (BID) | OROMUCOSAL | Status: DC
  Administered 2016-06-17 – 2016-06-18 (×2): 15 mL via OROMUCOSAL

## 2016-06-17 MED ORDER — PANTOPRAZOLE SODIUM 40 MG PO TBEC: 40.0000 mg | DELAYED_RELEASE_TABLET | Freq: Every day | ORAL | Status: DC

## 2016-06-17 MED ORDER — VANCOMYCIN HCL IN DEXTROSE 1-5 GM/200ML-% IV SOLN
1000.0000 mg | Freq: Once | INTRAVENOUS | Status: AC
  Administered 2016-06-17: 1000 mg via INTRAVENOUS
  Filled 2016-06-17: qty 200

## 2016-06-17 MED ORDER — METOPROLOL TARTRATE 5 MG/5ML IV SOLN
2.5000 mg | INTRAVENOUS | Status: DC | PRN
  Administered 2016-06-19: 5 mg via INTRAVENOUS
  Filled 2016-06-17: qty 5

## 2016-06-17 MED ORDER — CHLORHEXIDINE GLUCONATE 0.12 % MT SOLN
15.0000 mL | OROMUCOSAL | Status: AC
  Administered 2016-06-17: 15 mL via OROMUCOSAL

## 2016-06-17 MED ORDER — BISACODYL 10 MG RE SUPP
10.0000 mg | Freq: Every day | RECTAL | Status: DC
  Administered 2016-06-17: 10 mg via RECTAL
  Filled 2016-06-17: qty 1

## 2016-06-17 MED ORDER — ORAL CARE MOUTH RINSE
15.0000 mL | OROMUCOSAL | Status: DC
  Administered 2016-06-17 – 2016-06-18 (×8): 15 mL via OROMUCOSAL

## 2016-06-17 MED ORDER — MAGNESIUM SULFATE 4 GM/100ML IV SOLN
4.0000 g | Freq: Once | INTRAVENOUS | Status: AC
  Administered 2016-06-17: 4 g via INTRAVENOUS

## 2016-06-17 MED ORDER — OSELTAMIVIR PHOSPHATE 75 MG PO CAPS
75.0000 mg | ORAL_CAPSULE | Freq: Every day | ORAL | Status: DC
  Filled 2016-06-17: qty 1

## 2016-06-17 MED ORDER — FAMOTIDINE IN NACL 20-0.9 MG/50ML-% IV SOLN
20.0000 mg | Freq: Two times a day (BID) | INTRAVENOUS | Status: AC
  Administered 2016-06-17 (×2): 20 mg via INTRAVENOUS
  Filled 2016-06-17: qty 50

## 2016-06-17 MED ORDER — SODIUM CHLORIDE 0.9% FLUSH: 3.0000 mL | INTRAVENOUS | Status: DC | PRN

## 2016-06-17 MED ORDER — METOPROLOL TARTRATE 25 MG/10 ML ORAL SUSPENSION
12.5000 mg | Freq: Two times a day (BID) | ORAL | Status: DC
  Administered 2016-06-18 – 2016-06-20 (×2): 12.5 mg
  Filled 2016-06-17 (×2): qty 5

## 2016-06-17 MED ORDER — DEXTROSE 5 % IV SOLN
1.5000 g | Freq: Two times a day (BID) | INTRAVENOUS | Status: AC
  Administered 2016-06-17 – 2016-06-18 (×4): 1.5 g via INTRAVENOUS
  Filled 2016-06-17 (×4): qty 1.5

## 2016-06-17 MED ORDER — ACETAMINOPHEN 160 MG/5ML PO SOLN
1000.0000 mg | Freq: Four times a day (QID) | ORAL | Status: AC
  Administered 2016-06-17 – 2016-06-18 (×4): 1000 mg
  Filled 2016-06-17 (×4): qty 40.6

## 2016-06-17 MED ORDER — PNEUMOCOCCAL VAC POLYVALENT 25 MCG/0.5ML IJ INJ
0.5000 mL | INJECTION | INTRAMUSCULAR | Status: DC
  Filled 2016-06-17: qty 0.5

## 2016-06-17 MED ORDER — ACETAMINOPHEN 500 MG PO TABS
1000.0000 mg | ORAL_TABLET | Freq: Four times a day (QID) | ORAL | Status: AC
  Administered 2016-06-18 – 2016-06-21 (×11): 1000 mg via ORAL
  Filled 2016-06-17 (×12): qty 2

## 2016-06-17 MED FILL — Potassium Chloride Inj 2 mEq/ML: INTRAVENOUS | Qty: 40 | Status: AC

## 2016-06-17 MED FILL — Heparin Sodium (Porcine) Inj 1000 Unit/ML: INTRAMUSCULAR | Qty: 30 | Status: AC

## 2016-06-17 MED FILL — Magnesium Sulfate Inj 50%: INTRAMUSCULAR | Qty: 10 | Status: AC

## 2016-06-17 NOTE — Transfer of Care (Signed)
Immediate Anesthesia Transfer of Care Note  Patient: Bryce Ramirez  Procedure(s) Performed: Procedure(s): CORONARY ARTERY BYPASS GRAFTING (CABG) x 3 (N/A) ENDOVEIN HARVEST OF GREATER SAPHENOUS VEIN (Left)  Patient Location: SICU  Anesthesia Type:General  Level of Consciousness: oriented and Patient remains intubated per anesthesia plan  Airway & Oxygen Therapy: Patient remains intubated per anesthesia plan and Patient placed on Ventilator (see vital sign flow sheet for setting)  Post-op Assessment: Report given to RN and Post -op Vital signs reviewed and stable  Post vital signs: Reviewed and stable  Last Vitals:  Vitals:   06/16/16 1723 06/17/16 0035  BP: (!) 133/93 134/75  Pulse: 78 93  Resp: (!) 40 16  Temp:      Last Pain:  Vitals:   06/16/16 1703  TempSrc:   PainSc: 2       Patients Stated Pain Goal: 2 (06/16/16 1522)  Complications: No apparent anesthesia complications

## 2016-06-17 NOTE — Progress Notes (Signed)
  232 am called to remove 6 fr sheath and place TR Band. Pt. Intubated and sedated. R upper hand pre- sheath removal noted warm, pale with cap refill less than 3 seconds, noted pre-sheath removal sheath was not connected to pressure line, was able to withdraw 10 cc of heme from sheath prior to sheath removal, sutures removed, sheath removed without difficulty, TR banc placed with 11 cc of air in TR band, Al, RN at bedside, SpO2% probe to finger noting 100 %, slight radial pulse palpable. Pt. Remained intubated and sedated.with Al, RN at bedside.

## 2016-06-17 NOTE — Progress Notes (Signed)
Inpatient Diabetes Program Recommendations  AACE/ADA: New Consensus Statement on Inpatient Glycemic Control (2015)  Target Ranges:  Prepandial:   less than 140 mg/dL      Peak postprandial:   less than 180 mg/dL (1-2 hours)      Critically ill patients:  140 - 180 mg/dL   Lab Results  Component Value Date   GLUCAP 147 (H) 06/17/2016    Review of Glycemic ControlResults for Bryce Ramirez, Bryce Ramirez (MRN [id number]) as of 06/17/2016 15:45  Ref. Range 06/17/2016 08:58 06/17/2016 11:19 06/17/2016 12:07 06/17/2016 12:54 06/17/2016 13:54  Glucose-Capillary Latest Ref Range: 65 - 99 mg/dL 086167 (H) 578191 (H) 469202 (H) 174 (H) 147 (H)    Diabetes history: Type 1 diabetes Outpatient Diabetes medications: Insulin pump-Medtronic 670G Current orders for Inpatient glycemic control:  IV insulin post surgery  Inpatient Diabetes Program Recommendations:    Reviewed insulin pump which is disconnected from patient.  He has insulin pump that also has sensor which adjusts according to blood sugars.  His average basal dose is approximately 40.9 units/24 hours and he covers 1 unit for every 10 grams of CHO.  Agree with IV insulin at this time.  Will continue to follow.  Discussed with RN.   Thanks, Beryl MeagerJenny Murel Wigle, RN, BC-ADM Inpatient Diabetes Coordinator Pager [phone] (8a-5p)

## 2016-06-17 NOTE — Plan of Care (Signed)
Problem: Cardiac: Goal: Hemodynamic stability will improve Outcome: Progressing Pt had stable day of rest per plan of care  Problem: Respiratory: Goal: Ability to tolerate decreased levels of ventilator support will improve Outcome: Progressing FiO2 weaned successfully today

## 2016-06-17 NOTE — Care Management Note (Signed)
Case Management Note  Patient Details  Name: Bryce Ramirez MRN: [id number] Date of Birth: 07/31/1955  Subjective/Objective:   CABG 06/16/2016                  Action/Plan: Discharge Planning: Chart reviewed. NCM will continue to monitor for dc needs.    Expected Discharge Date:                  Expected Discharge Plan:  Home/Self Care  In-House Referral:  NA  Discharge planning Services  CM Consult  Post Acute Care Choice:  NA Choice offered to:  NA  DME Arranged:  N/A DME Agency:  NA  HH Arranged:  NA HH Agency:  NA  Status of Service:  In process, will continue to follow  If discussed at Long Length of Stay Meetings, dates discussed:    Additional Comments:  [name], [name], RN 06/17/2016, 11:18 AM

## 2016-06-17 NOTE — Progress Notes (Signed)
      [address]       [place] [id number]             [phone]      Intubated, sedated  BP (!) 122/52 (BP Location: Left Arm)   Pulse 89   Temp 100.2 F (37.9 C) (Core (Comment))   Resp (!) 0   Ht 5\' 10"  (1.778 m)   Wt 185 lb (83.9 kg)   SpO2 99%   BMI 26.54 kg/m  CI= 2.1 on dopamine @ 3 and milrinone @ 0.3 IABP 1:1   Intake/Output Summary (Last 24 hours) at 06/17/16 1759 Last data filed at 06/17/16 1709  Gross per 24 hour  Intake           6528.1 ml  Output             5640 ml  Net            888.1 ml    Hc= 33, creatinine 0.9, K= 4.0  Continue current care, try to extubate in AM  Gavinn Collard C. Dorris FetchHendrickson, MD [facility] [phone]

## 2016-06-17 NOTE — Progress Notes (Signed)
Patient Name: Bryce Ramirez Date of Encounter: 06/17/2016  Primary Cardiologist: [facility] Problem List     Active Problems:   ST elevation myocardial infarction (STEMI) (HCC)   S/P CABG x 3     Subjective   Intubated, sedated.   Inpatient Medications    Scheduled Meds: . acetaminophen  1,000 mg Oral Q6H   Or  . acetaminophen (TYLENOL) oral liquid 160 mg/5 mL  1,000 mg Per Tube Q6H  . aspirin EC  325 mg Oral Daily   Or  . aspirin  324 mg Per Tube Daily  . bisacodyl  10 mg Oral Daily   Or  . bisacodyl  10 mg Rectal Daily  . cefUROXime (ZINACEF)  IV  1.5 g Intravenous Q12H  . chlorhexidine  15 mL Mouth Rinse BID  . docusate sodium  200 mg Oral Daily  . famotidine (PEPCID) IV  20 mg Intravenous Q12H  . furosemide  20 mg Intravenous BID  . insulin regular  0-10 Units Intravenous TID WC  . mouth rinse  15 mL Mouth Rinse q12n4p  . metoCLOPramide (REGLAN) injection  10 mg Intravenous Q6H  . metoprolol tartrate  12.5 mg Oral BID   Or  . metoprolol tartrate  12.5 mg Per Tube BID  . sodium chloride flush  3 mL Intravenous Q12H  . vancomycin  1,000 mg Intravenous Once   Continuous Infusions: . sodium chloride 20 mL/hr at 06/17/16 0900  . sodium chloride Stopped (06/17/16 0800)  . sodium chloride 10 mL/hr at 06/17/16 0900  . dexmedetomidine 0.6 mcg/kg/hr (06/17/16 0810)  . EPINEPHrine 4 mg in dextrose 5% 250 mL infusion (16 mcg/mL) 3 mcg/min (06/17/16 0900)  . insulin (NOVOLIN-R) infusion 4.3 Units/hr (06/17/16 0900)  . lactated ringers    . lactated ringers 20 mL/hr at 06/17/16 0900  . milrinone 0.3 mcg/kg/min (06/17/16 0900)  . norepinephrine (LEVOPHED) Adult infusion 3 mcg/min (06/17/16 0900)   PRN Meds: sodium chloride, albumin human, metoprolol, midazolam, morphine injection, morphine injection, ondansetron (ZOFRAN) IV, oxyCODONE, sodium chloride flush, traMADol   Vital Signs    Vitals:   06/17/16 0912 06/17/16 0915 06/17/16 0930 06/17/16 0945  BP: (!)  131/57     Pulse: 90 88 91 88  Resp: (!) 34 (!) 21 (!) 27 (!) 25  Temp: 99.1 F (37.3 C) 99.1 F (37.3 C) 99.3 F (37.4 C) 99.3 F (37.4 C)  TempSrc:      SpO2: 100% 96% 96% 98%  Weight:      Height:        Intake/Output Summary (Last 24 hours) at 06/17/16 0952 Last data filed at 06/17/16 0900  Gross per 24 hour  Intake          5334.27 ml  Output             4270 ml  Net          1064.27 ml   Filed Weights   06/16/16 1441  Weight: 185 lb (83.9 kg)    Physical Exam    GEN: Well nourished, well developed, Intubated, sedated HEENT: Grossly normal.  Neck: Supple, no JVD, carotid bruits, or masses. Cardiac: RRR, no murmurs, rubs, or gallops. No clubbing, cyanosis, edema.  Respiratory:  Respirations regular and unlabored, clear to auscultation bilaterally. GI: Soft, nontender, nondistended, BS + x 4. MS: no deformity or atrophy. Skin: warm and dry, no rash. Neuro:  Strength and sensation are intact. Psych: Sedated Ext: Right BKA  Labs    CBC  Recent Labs  06/16/16 1500  06/17/16 0047 06/17/16 0600  WBC 13.4*  --  16.7* 15.9*  NEUTROABS 10.5*  --   --   --   HGB 13.5  < > 12.0* 11.8*  HCT 41.2  < > 35.7* 35.2*  MCV 89.0  --  87.7 87.1  PLT 281  < > 285 276  < > = values in this interval not displayed. Basic Metabolic Panel  Recent Labs  06/16/16 1500  06/16/16 2304 06/17/16 0040 06/17/16 0600  NA 137  133*  < > 137 139 138  K 4.3  4.2  < > 4.1 3.6 4.1  CL 98*  95*  < > 97*  --  105  CO2 28  29  --   --   --  27  GLUCOSE 136*  137*  < > 218* 179* 166*  BUN 23*  23*  < > 18  --  17  CREATININE 1.05  1.08  < > 0.70  --  0.93  CALCIUM 9.2  8.9  --   --   --  7.5*  MG  --   --   --   --  3.0*  < > = values in this interval not displayed. Liver Function Tests  Recent Labs  06/16/16 1500  AST 50*  ALT 37  ALKPHOS 85  BILITOT 0.4  PROT 7.5  ALBUMIN 3.9   Recent Labs  06/16/16 1500  TROPONINI 1.96*   Fasting Lipid Panel  Recent  Labs  06/16/16 1459  CHOL 116  HDL 44  LDLCALC 51  TRIG 104  CHOLHDL 2.6   Telemetry    Sinus - Personally Reviewed  Radiology     Cardiac Studies   Cardiac cath 06/16/16: Wall Motion              Left Heart   Left Ventricle The left ventricle is dilated. There is severe left ventricular systolic dysfunction. LV end diastolic pressure is moderately elevated. The left ventricular ejection fraction is less than 25% by visual estimate. No regional wall motion abnormalities. There is no evidence of mitral regurgitation.    Coronary Diagrams   Diagnostic Diagram     Implants     No implant documentation for this case.  PACS Images   Show images for Cardiac catheterization   Link to Procedure Log   Procedure Log    Hemo Data   Flowsheet Row Most Recent Value  LV Systolic Pressure 111 mmHg  LV Diastolic Pressure 20 mmHg  LV EDP 27 mmHg  Arterial Occlusion Pressure Extended Systolic Pressure 97 mmHg  Arterial Occlusion Pressure Extended Diastolic Pressure 52 mmHg  Arterial Occlusion Pressure Extended Mean Pressure 70 mmHg  Left Ventricular Apex Extended Systolic Pressure 97 mmHg  Left Ventricular Apex Extended Diastolic Pressure 14 mmHg  Left Ventricular Apex Extended EDP Pressure 25 mmHg     Patient Profile     61 yo male with type 1 DM, HTN, CAD admitted with anterior STEMI and found to have severe stenosis in the distal LM, ostial LAD, ostial Circumflex, OM2, proximal RCA. Emergent 3V CABG 06/16/16.   Assessment & Plan    1. CAD/Anterior STEMI: Pt admitted with anterior STEMI. 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 Now s/p 3V CABG. Stable this am. He is on ASA and beta blocker. Still on pressors post op.   2. Ischemic cardiomyopathy: LVEF less than 20% by LV gram. Hopefully will improve with revascularization  3. DM: Insulin  drip  4. Community acquired pneumonia: continue antibiotics.   Signed, Verne Carrowhristopher Bethanne Mule, MD  06/17/2016, 9:52 AM

## 2016-06-17 NOTE — Progress Notes (Signed)
1 Day Post-Op Procedure(s) (LRB): CORONARY ARTERY BYPASS GRAFTING (CABG) x 3 (N/A) ENDOVEIN HARVEST OF GREATER SAPHENOUS VEIN (Left) Subjective: emerg CABG for MI cardiogenic shock preop IABP Responsive on vent Oxygenation improved CI 2.1 Objective: Vital signs in last 24 hours: Temp:  [98.1 F (36.7 C)-99.4 F (37.4 C)] 99.3 F (37.4 C) (01/08 0945) Pulse Rate:  [43-99] 88 (01/08 0945) Cardiac Rhythm: Atrial paced (01/08 0845) Resp:  [9-46] 25 (01/08 0945) BP: (89-147)/(55-93) 131/57 (01/08 0912) SpO2:  [82 %-100 %] 98 % (01/08 0945) Arterial Line BP: (101-147)/(35-62) 114/45 (01/08 0945) FiO2 (%):  [50 %-100 %] 50 % (01/08 0912) Weight:  [185 lb (83.9 kg)] 185 lb (83.9 kg) (01/07 1441)  Hemodynamic parameters for last 24 hours: PAP: (34-49)/(14-27) 34/15 CO:  [3.2 L/min-3.9 L/min] 3.8 L/min CI:  [1.6 L/min/m2-1.9 L/min/m2] 1.9 L/min/m2  Intake/Output from previous day: 01/07 0701 - 01/08 0700 In: 5057.8 [I.V.:3117.8; Blood:1510; IV Piggyback:430] Out: 4165 [Urine:2150; Emesis/NG output:50; Blood:1625; Chest Tube:340] Intake/Output this shift: Total I/O In: 276.5 [I.V.:166.5; NG/GT:60; IV Piggyback:50] Out: 105 [Urine:75; Chest Tube:30]  Sedated on vent edematous + pulses in LLE Lab Results:  Recent Labs  06/17/16 0047 06/17/16 0600  WBC 16.7* 15.9*  HGB 12.0* 11.8*  HCT 35.7* 35.2*  PLT 285 276   BMET:  Recent Labs  06/16/16 1500  06/16/16 2304 06/17/16 0040 06/17/16 0600  NA 137  133*  < > 137 139 138  K 4.3  4.2  < > 4.1 3.6 4.1  CL 98*  95*  < > 97*  --  105  CO2 28  29  --   --   --  27  GLUCOSE 136*  137*  < > 218* 179* 166*  BUN 23*  23*  < > 18  --  17  CREATININE 1.05  1.08  < > 0.70  --  0.93  CALCIUM 9.2  8.9  --   --   --  7.5*  < > = values in this interval not displayed.  PT/INR:  Recent Labs  06/17/16 0047  LABPROT 15.8*  INR 1.25   ABG    Component Value Date/Time   PHART 7.403 06/17/2016 0553   HCO3 26.6  06/17/2016 0553   TCO2 28 06/17/2016 0553   O2SAT 100.0 06/17/2016 0553   CBG (last 3)   Recent Labs  06/17/16 0553 06/17/16 0647 06/17/16 0801  GLUCAP 176* 167* 165*    Assessment/Plan: S/P Procedure(s) (LRB): CORONARY ARTERY BYPASS GRAFTING (CABG) x 3 (N/A) ENDOVEIN HARVEST OF GREATER SAPHENOUS VEIN (Left) Day of rest  Diuresis as BP tolerates   LOS: 1 day    Bryce Ramirez 06/17/2016

## 2016-06-17 NOTE — Progress Notes (Signed)
Inpatient Diabetes Program Recommendations  AACE/ADA: New Consensus Statement on Inpatient Glycemic Control (2015)  Target Ranges:  Prepandial:   less than 140 mg/dL      Peak postprandial:   less than 180 mg/dL (1-2 hours)      Critically ill patients:  140 - 180 mg/dL   Lab Results  Component Value Date   GLUCAP 167 (H) 06/17/2016    Review of Glycemic Control:  Results for Bryce Ramirez, Bryce Ramirez (MRN [id number]) as of 06/17/2016 12:41  Ref. Range 06/17/2016 04:54 06/17/2016 05:53 06/17/2016 06:47 06/17/2016 08:01 06/17/2016 08:58  Glucose-Capillary Latest Ref Range: 65 - 99 mg/dL 409160 (H) 811176 (H) 914167 (H) 165 (H) 167 (H)    Diabetes history: Type 1 diabetes Outpatient Diabetes medications: Insulin pump per H&P Current orders for Inpatient glycemic control:  IV insulin per TCTS order set  Inpatient Diabetes Program Recommendations:   Agree with IV insulin.  Consider continuation of insulin drip until patient alert and able to restart insulin pump due to history of Type 1 diabetes.   Thanks, Beryl MeagerJenny Liandro Thelin, RN, BC-ADM Inpatient Diabetes Coordinator Pager [phone] (8a-5p)

## 2016-06-18 ENCOUNTER — Inpatient Hospital Stay (HOSPITAL_COMMUNITY): Payer: BLUE CROSS/BLUE SHIELD

## 2016-06-18 LAB — POCT I-STAT, CHEM 8
BUN: 18 mg/dL (ref 6–20)
CHLORIDE: 101 mmol/L (ref 101–111)
CREATININE: 0.7 mg/dL (ref 0.61–1.24)
Calcium, Ion: 1.18 mmol/L (ref 1.15–1.40)
GLUCOSE: 120 mg/dL — AB (ref 65–99)
HEMATOCRIT: 31 % — AB (ref 39.0–52.0)
Hemoglobin: 10.5 g/dL — ABNORMAL LOW (ref 13.0–17.0)
POTASSIUM: 4.1 mmol/L (ref 3.5–5.1)
Sodium: 138 mmol/L (ref 135–145)
TCO2: 27 mmol/L (ref 0–100)

## 2016-06-18 LAB — POCT I-STAT 3, ART BLOOD GAS (G3+)
ACID-BASE EXCESS: 1 mmol/L (ref 0.0–2.0)
ACID-BASE EXCESS: 3 mmol/L — AB (ref 0.0–2.0)
Acid-Base Excess: 1 mmol/L (ref 0.0–2.0)
Acid-Base Excess: 2 mmol/L (ref 0.0–2.0)
Acid-Base Excess: 3 mmol/L — ABNORMAL HIGH (ref 0.0–2.0)
Acid-Base Excess: 5 mmol/L — ABNORMAL HIGH (ref 0.0–2.0)
Acid-Base Excess: 5 mmol/L — ABNORMAL HIGH (ref 0.0–2.0)
BICARBONATE: 25.6 mmol/L (ref 20.0–28.0)
BICARBONATE: 28.6 mmol/L — AB (ref 20.0–28.0)
BICARBONATE: 28 mmol/L (ref 20.0–28.0)
BICARBONATE: 26 mmol/L (ref 20.0–28.0)
Bicarbonate: 26.6 mmol/L (ref 20.0–28.0)
Bicarbonate: 28 mmol/L (ref 20.0–28.0)
Bicarbonate: 28.6 mmol/L — ABNORMAL HIGH (ref 20.0–28.0)
O2 SAT: 94 %
O2 Saturation: 94 %
O2 Saturation: 95 %
O2 Saturation: 97 %
O2 Saturation: 99 %
O2 Saturation: 99 %
O2 Saturation: 99 %
PCO2 ART: 38.5 mmHg (ref 32.0–48.0)
PCO2 ART: 38.5 mmHg (ref 32.0–48.0)
PCO2 ART: 40 mmHg (ref 32.0–48.0)
PCO2 ART: 46 mmHg (ref 32.0–48.0)
PCO2 ART: 46 mmHg (ref 32.0–48.0)
PH ART: 7.394 (ref 7.350–7.450)
PH ART: 7.394 (ref 7.350–7.450)
PH ART: 7.434 (ref 7.350–7.450)
PH ART: 7.481 — AB (ref 7.350–7.450)
PO2 ART: 123 mmHg — AB (ref 83.0–108.0)
PO2 ART: 123 mmHg — AB (ref 83.0–108.0)
PO2 ART: 71 mmHg — AB (ref 83.0–108.0)
PO2 ART: 71 mmHg — AB (ref 83.0–108.0)
PO2 ART: 78 mmHg — AB (ref 83.0–108.0)
Patient temperature: 37.3
Patient temperature: 37.3
Patient temperature: 37.3
Patient temperature: 37.3
TCO2: 27 mmol/L (ref 0–100)
TCO2: 27 mmol/L (ref 0–100)
TCO2: 28 mmol/L (ref 0–100)
TCO2: 29 mmol/L (ref 0–100)
TCO2: 29 mmol/L (ref 0–100)
TCO2: 30 mmol/L (ref 0–100)
TCO2: 30 mmol/L (ref 0–100)
pCO2 arterial: 40.1 mmHg (ref 32.0–48.0)
pCO2 arterial: 43.7 mmHg (ref 32.0–48.0)
pH, Arterial: 7.415 (ref 7.350–7.450)
pH, Arterial: 7.481 — ABNORMAL HIGH (ref 7.350–7.450)
pH, Arterial: 7.383 (ref 7.350–7.450)
pO2, Arterial: 118 mmHg — ABNORMAL HIGH (ref 83.0–108.0)
pO2, Arterial: 95 mmHg (ref 83.0–108.0)

## 2016-06-18 LAB — CBC
HCT: 31.6 % — ABNORMAL LOW (ref 39.0–52.0)
Hemoglobin: 10.7 g/dL — ABNORMAL LOW (ref 13.0–17.0)
MCH: 29.8 pg (ref 26.0–34.0)
MCHC: 33.9 g/dL (ref 30.0–36.0)
MCV: 88 fL (ref 78.0–100.0)
Platelets: 195 10*3/uL (ref 150–400)
RBC: 3.59 MIL/uL — ABNORMAL LOW (ref 4.22–5.81)
RDW: 13.4 % (ref 11.5–15.5)
WBC: 12 10*3/uL — ABNORMAL HIGH (ref 4.0–10.5)

## 2016-06-18 LAB — GLUCOSE, CAPILLARY
GLUCOSE-CAPILLARY: 106 mg/dL — AB (ref 65–99)
GLUCOSE-CAPILLARY: 115 mg/dL — AB (ref 65–99)
GLUCOSE-CAPILLARY: 119 mg/dL — AB (ref 65–99)
GLUCOSE-CAPILLARY: 124 mg/dL — AB (ref 65–99)
GLUCOSE-CAPILLARY: 128 mg/dL — AB (ref 65–99)
GLUCOSE-CAPILLARY: 134 mg/dL — AB (ref 65–99)
GLUCOSE-CAPILLARY: 137 mg/dL — AB (ref 65–99)
GLUCOSE-CAPILLARY: 150 mg/dL — AB (ref 65–99)
GLUCOSE-CAPILLARY: 150 mg/dL — AB (ref 65–99)
GLUCOSE-CAPILLARY: 163 mg/dL — AB (ref 65–99)
GLUCOSE-CAPILLARY: 169 mg/dL — AB (ref 65–99)
Glucose-Capillary: 102 mg/dL — ABNORMAL HIGH (ref 65–99)
Glucose-Capillary: 125 mg/dL — ABNORMAL HIGH (ref 65–99)
Glucose-Capillary: 134 mg/dL — ABNORMAL HIGH (ref 65–99)
Glucose-Capillary: 152 mg/dL — ABNORMAL HIGH (ref 65–99)
Glucose-Capillary: 155 mg/dL — ABNORMAL HIGH (ref 65–99)
Glucose-Capillary: 166 mg/dL — ABNORMAL HIGH (ref 65–99)
Glucose-Capillary: 175 mg/dL — ABNORMAL HIGH (ref 65–99)
Glucose-Capillary: 206 mg/dL — ABNORMAL HIGH (ref 65–99)
Glucose-Capillary: 94 mg/dL (ref 65–99)

## 2016-06-18 LAB — COMPREHENSIVE METABOLIC PANEL
ALT: 35 U/L (ref 17–63)
AST: 112 U/L — ABNORMAL HIGH (ref 15–41)
Albumin: 2.7 g/dL — ABNORMAL LOW (ref 3.5–5.0)
Alkaline Phosphatase: 52 U/L (ref 38–126)
Anion gap: 8 (ref 5–15)
BUN: 15 mg/dL (ref 6–20)
CO2: 25 mmol/L (ref 22–32)
Calcium: 7.6 mg/dL — ABNORMAL LOW (ref 8.9–10.3)
Chloride: 105 mmol/L (ref 101–111)
Creatinine, Ser: 0.88 mg/dL (ref 0.61–1.24)
GFR calc Af Amer: >60 mL/min (ref >60)
GFR calc non Af Amer: >60 mL/min (ref >60)
Glucose, Bld: 138 mg/dL — ABNORMAL HIGH (ref 65–99)
Potassium: 3.8 mmol/L (ref 3.5–5.1)
Sodium: 138 mmol/L (ref 135–145)
Total Bilirubin: 0.4 mg/dL (ref 0.3–1.2)
Total Protein: 5.1 g/dL — ABNORMAL LOW (ref 6.5–8.1)

## 2016-06-18 LAB — COOXEMETRY PANEL
Carboxyhemoglobin: 0.6 % (ref 0.5–1.5)
Carboxyhemoglobin: 0.6 % (ref 0.5–1.5)
Methemoglobin: 1.3 % (ref 0.0–1.5)
Methemoglobin: 1.3 % (ref 0.0–1.5)
O2 Saturation: 52.2 %
O2 Saturation: 52.5 %
Total hemoglobin: 11.1 g/dL — ABNORMAL LOW (ref 12.0–16.0)
Total hemoglobin: 11 g/dL — ABNORMAL LOW (ref 12.0–16.0)

## 2016-06-18 MED ORDER — FENTANYL 50 MCG/HR TD PT72
50.0000 ug | MEDICATED_PATCH | TRANSDERMAL | Status: DC
  Administered 2016-06-18 – 2016-06-24 (×2): 50 ug via TRANSDERMAL
  Filled 2016-06-18 (×4): qty 2

## 2016-06-18 MED ORDER — ORAL CARE MOUTH RINSE
15.0000 mL | Freq: Two times a day (BID) | OROMUCOSAL | Status: DC
  Administered 2016-06-18 – 2016-06-22 (×6): 15 mL via OROMUCOSAL

## 2016-06-18 MED ORDER — INSULIN ASPART 100 UNIT/ML ~~LOC~~ SOLN
0.0000 [IU] | SUBCUTANEOUS | Status: DC
  Administered 2016-06-19: 8 [IU] via SUBCUTANEOUS
  Administered 2016-06-19: 2 [IU] via SUBCUTANEOUS
  Administered 2016-06-19 – 2016-06-20 (×3): 4 [IU] via SUBCUTANEOUS

## 2016-06-18 MED ORDER — CHLORHEXIDINE GLUCONATE 0.12 % MT SOLN
15.0000 mL | Freq: Two times a day (BID) | OROMUCOSAL | Status: DC
  Administered 2016-06-18: 15 mL via OROMUCOSAL

## 2016-06-18 MED ORDER — INSULIN DETEMIR 100 UNIT/ML ~~LOC~~ SOLN
20.0000 [IU] | Freq: Two times a day (BID) | SUBCUTANEOUS | Status: DC
  Administered 2016-06-18 – 2016-06-19 (×3): 20 [IU] via SUBCUTANEOUS
  Filled 2016-06-18 (×4): qty 0.2

## 2016-06-18 MED FILL — Sodium Bicarbonate IV Soln 8.4%: INTRAVENOUS | Qty: 50 | Status: AC

## 2016-06-18 MED FILL — Heparin Sodium (Porcine) Inj 1000 Unit/ML: INTRAMUSCULAR | Qty: 10 | Status: AC

## 2016-06-18 MED FILL — Mannitol IV Soln 20%: INTRAVENOUS | Qty: 500 | Status: AC

## 2016-06-18 MED FILL — Electrolyte-R (PH 7.4) Solution: INTRAVENOUS | Qty: 6000 | Status: AC

## 2016-06-18 MED FILL — Sodium Chloride IV Soln 0.9%: INTRAVENOUS | Qty: 3000 | Status: AC

## 2016-06-18 MED FILL — Lidocaine HCl IV Inj 20 MG/ML: INTRAVENOUS | Qty: 5 | Status: AC

## 2016-06-18 MED FILL — Calcium Chloride Inj 10%: INTRAVENOUS | Qty: 10 | Status: AC

## 2016-06-18 NOTE — Progress Notes (Signed)
Patient Name: Bryce Ramirez Date of Encounter: 06/18/2016  Primary Cardiologist: [facility] Problem List     Active Problems:   ST elevation myocardial infarction (STEMI) ([place])   S/P CABG x 3     Subjective   Intubated, sedated.   Inpatient Medications    Scheduled Meds: . acetaminophen  1,000 mg Oral Q6H   Or  . acetaminophen (TYLENOL) oral liquid 160 mg/5 mL  1,000 mg Per Tube Q6H  . aspirin EC  325 mg Oral Daily   Or  . aspirin  324 mg Per Tube Daily  . bisacodyl  10 mg Oral Daily   Or  . bisacodyl  10 mg Rectal Daily  . cefUROXime (ZINACEF)  IV  1.5 g Intravenous Q12H  . chlorhexidine gluconate (MEDLINE KIT)  15 mL Mouth Rinse BID  . docusate sodium  200 mg Oral Daily  . famotidine (PEPCID) IV  20 mg Intravenous Q12H  . furosemide  20 mg Intravenous BID  . insulin regular  0-10 Units Intravenous TID WC  . mouth rinse  15 mL Mouth Rinse 10 times per day  . metoCLOPramide (REGLAN) injection  10 mg Intravenous Q6H  . metoprolol tartrate  12.5 mg Oral BID   Or  . metoprolol tartrate  12.5 mg Per Tube BID  . [START ON 06/21/2016] pneumococcal 23 valent vaccine  0.5 mL Intramuscular Tomorrow-1000  . sodium chloride flush  3 mL Intravenous Q12H   Continuous Infusions: . sodium chloride 20 mL/hr at 06/18/16 0700  . sodium chloride Stopped (06/17/16 0800)  . sodium chloride 10 mL/hr at 06/18/16 0700  . dexmedetomidine 0.5 mcg/kg/hr (06/18/16 0826)  . EPINEPHrine 4 mg in dextrose 5% 250 mL infusion (16 mcg/mL) 2 mcg/min (06/18/16 0700)  . insulin (NOVOLIN-R) infusion 3.6 Units/hr (06/18/16 0821)  . lactated ringers    . lactated ringers 20 mL/hr at 06/18/16 0700  . milrinone 0.3 mcg/kg/min (06/18/16 0700)  . norepinephrine (LEVOPHED) Adult infusion Stopped (06/17/16 1742)   PRN Meds: sodium chloride, metoprolol, midazolam, morphine injection, ondansetron (ZOFRAN) IV, oxyCODONE, sodium chloride flush, traMADol   Vital Signs    Vitals:   06/18/16 0800  06/18/16 0813 06/18/16 0842 06/18/16 0900  BP: (!) 135/97   (!) 111/54  Pulse: 82     Resp: 16     Temp: 99.5 F (37.5 C)     TempSrc:      SpO2: 100% 99% 100%   Weight:      Height:        Intake/Output Summary (Last 24 hours) at 06/18/16 0908 Last data filed at 06/18/16 0800  Gross per 24 hour  Intake          2759.87 ml  Output             2355 ml  Net           404.87 ml   Filed Weights   06/16/16 1441 06/17/16 0100 06/18/16 0550  Weight: 185 lb (83.9 kg) 184 lb 15.5 oz (83.9 kg) 199 lb 11.8 oz (90.6 kg)    Physical Exam    GEN: Well nourished, well developed, Intubated, sedated HEENT: Grossly normal.  Neck: Supple, no JVD, carotid bruits, or masses. Cardiac: RRR, no murmurs, rubs, or gallops. No clubbing, cyanosis, edema.  Respiratory:  Respirations regular and unlabored, clear to auscultation bilaterally. GI: Soft, nontender, nondistended, BS + x 4. MS: no deformity or atrophy. Skin: warm and dry, no rash. Neuro:  Strength and sensation are  intact. Psych: Sedated Ext: Right BKA  Labs    CBC  Recent Labs  06/16/16 1500  06/17/16 1645 06/17/16 1654 06/18/16 0420  WBC 13.4*  < > 14.4*  --  12.0*  NEUTROABS 10.5*  --   --   --   --   HGB 13.5  < > 11.3* 11.2* 10.7*  HCT 41.2  < > 33.4* 33.0* 31.6*  MCV 89.0  < > 86.5  --  88.0  PLT 281  < > 243  --  195  < > = values in this interval not displayed. Basic Metabolic Panel  Recent Labs  06/17/16 0600  06/17/16 1645 06/17/16 1654 06/18/16 0420  NA 138  < >  --  140 138  K 4.1  < >  --  4.0 3.8  CL 105  < >  --  102 105  CO2 27  --   --   --  25  GLUCOSE 166*  < >  --  145* 138*  BUN 17  < >  --  17 15  CREATININE 0.93  < > 0.98 0.90 0.88  CALCIUM 7.5*  --   --   --  7.6*  MG 3.0*  --  2.3  --   --   < > = values in this interval not displayed. Liver Function Tests  Recent Labs  06/16/16 1500 06/18/16 0420  AST 50* 112*  ALT 37 35  ALKPHOS 85 52  BILITOT 0.4 0.4  PROT 7.5 5.1*  ALBUMIN  3.9 2.7*    Recent Labs  06/16/16 1500  TROPONINI 1.96*   Fasting Lipid Panel  Recent Labs  06/16/16 1459  CHOL 116  HDL 44  LDLCALC 51  TRIG 104  CHOLHDL 2.6   Telemetry    Sinus - Personally Reviewed  Radiology     Cardiac Studies   Cardiac cath 06/16/16: Wall Motion              Left Heart   Left Ventricle The left ventricle is dilated. There is severe left ventricular systolic dysfunction. LV end diastolic pressure is moderately elevated. The left ventricular ejection fraction is less than 25% by visual estimate. No regional wall motion abnormalities. There is no evidence of mitral regurgitation.    Coronary Diagrams   Diagnostic Diagram     Implants     No implant documentation for this case.  PACS Images   Show images for Cardiac catheterization   Link to Procedure Log   Procedure Log    Hemo Data   Flowsheet Row Most Recent Value  LV Systolic Pressure 694 mmHg  LV Diastolic Pressure 20 mmHg  LV EDP 27 mmHg  Arterial Occlusion Pressure Extended Systolic Pressure 97 mmHg  Arterial Occlusion Pressure Extended Diastolic Pressure 52 mmHg  Arterial Occlusion Pressure Extended Mean Pressure 70 mmHg  Left Ventricular Apex Extended Systolic Pressure 97 mmHg  Left Ventricular Apex Extended Diastolic Pressure 14 mmHg  Left Ventricular Apex Extended EDP Pressure 25 mmHg     Patient Profile     61 yo male with type 1 DM, HTN, CAD admitted with anterior STEMI and found to have severe stenosis in the distal LM, ostial LAD, ostial Circumflex, OM2, proximal RCA. Emergent 3V CABG 06/16/16.   Assessment & Plan    1. CAD/Anterior STEMI: Pt admitted with anterior STEMI. 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 Now s/p 3V CABG. Stable this am. He is on ASA and beta blocker. Still on  low dose pressors post op.   2. Ischemic cardiomyopathy: LVEF less than 20% by LV gram. Hopefully will improve with revascularization. Will repeat echo later this week. If LVEF is below  35%, will need Lifevest before discharge.   3. DM: Insulin drip  4. Community acquired pneumonia: continue antibiotics.   Signed, Lauree Chandler, MD  06/18/2016, 9:08 AM

## 2016-06-18 NOTE — Progress Notes (Signed)
Spoke with patient regarding SQ insulin pump.  He has had Type 1 diabetes since age 61. He states that does not have "Continuous Glucose" monitor that sync's with his insulin pump at this time. He state that he got new insulin pump last May, 2017. He has never been trained on automatic mode of 670G and therefore has always used insulin pump in manual mode.    Patient state that he prefers Lantus/Novolog regimen once off insulin drip.  Based on insulin pump settings, his basal insulin needs are approximately 41 units/24 hours. Therefore he will need approximately 40 units of Lantus daily.  He also states that he was taking Novolog 6-8 units with breakfast and supper and 5-7 units with lunch.  Agree with continuation of insulin drip at this time.  Will follow.  Thanks, Beryl MeagerJenny Linkoln Alkire, RN, BC-ADM Inpatient Diabetes Coordinator Pager [phone] (8a-5p)

## 2016-06-18 NOTE — Progress Notes (Signed)
Patient ID: Bryce Ramirez, male   DOB: 05/27/1956, 61 y.o.   MRN: [id number] EVENING ROUNDS NOTE :     [address]       [place] [id number]             [phone]                 2 Days Post-Op Procedure(s) (LRB): CORONARY ARTERY BYPASS GRAFTING (CABG) x 3 (N/A) ENDOVEIN HARVEST OF GREATER SAPHENOUS VEIN (Left)  Total Length of Stay:  LOS: 2 days  BP (!) 132/56   Pulse 92   Temp 99 F (37.2 C)   Resp 15   Ht 5\' 10"  (1.778 m)   Wt 199 lb 11.8 oz (90.6 kg)   SpO2 99%   BMI 28.66 kg/m   .Intake/Output      01/08 0701 - 01/09 0700 01/09 0701 - 01/10 0700   P.O. 0 120   I.V. (mL/kg) 2313.2 (25.5) 774.9 (8.6)   Blood     NG/GT 210    IV Piggyback 400 100   Total Intake(mL/kg) 2923.2 (32.3) 994.9 (11)   Urine (mL/kg/hr) 1520 (0.7) 960 (0.9)   Emesis/NG output 600 (0.3) 0 (0)   Blood     Chest Tube 500 (0.2) 450 (0.4)   Total Output 2620 1410   Net +303.2 -415.1          . sodium chloride 20 mL/hr at 06/18/16 0900  . sodium chloride Stopped (06/17/16 0800)  . sodium chloride 10 mL/hr at 06/18/16 0900  . dexmedetomidine Stopped (06/18/16 0920)  . EPINEPHrine 4 mg in dextrose 5% 250 mL infusion (16 mcg/mL) Stopped (06/18/16 1651)  . insulin (NOVOLIN-R) infusion 2.2 Units/hr (06/18/16 1800)  . lactated ringers    . lactated ringers 20 mL/hr at 06/18/16 0900  . milrinone 0.3 mcg/kg/min (06/18/16 0939)     Lab Results  Component Value Date   WBC 12.0 (H) 06/18/2016   HGB 10.5 (L) 06/18/2016   HCT 31.0 (L) 06/18/2016   PLT 195 06/18/2016   GLUCOSE 120 (H) 06/18/2016   CHOL 116 06/16/2016   TRIG 104 06/16/2016   HDL 44 06/16/2016   LDLCALC 51 06/16/2016   ALT 35 06/18/2016   AST 112 (H) 06/18/2016   NA 138 06/18/2016   K 4.1 06/18/2016   CL 101 06/18/2016   CREATININE 0.70 06/18/2016   BUN 18 06/18/2016   CO2 25 06/18/2016   INR 1.25 06/17/2016   IAB 1:2  Right stump ok not ischemic 450 from ct today 960 uop , 20 mg lasix bid   COX 52.2  this am at 1700 52.5  I have seen and examined Bryce Ramirez and agree with the above assessment  and plan.  Delight OvensEdward B Kamaryn Grimley MD  Beeper [phone] Office [phone] 06/18/2016 6:28 PM

## 2016-06-18 NOTE — Progress Notes (Signed)
Wean IABP to 1:3 at 11pm/2300 per Dr. Donata ClayVan Trigt.  Hermina BartersBOWMAN, Delawrence Fridman M, RN

## 2016-06-18 NOTE — Procedures (Signed)
Extubation Procedure Note  Patient Details:   Name: Bryce Ramirez DOB: 04/17/1956 MRN: [id number]   Airway Documentation: Pt alert and oriented following commands.  NIF -40 cmh20, VC 1.3 L/min.  Extubated to 4 lpm Scott sat 99%.  Pt stated name, where he was.  IS instruction given able to do 10 x 1250.  Strong cough.    Evaluation  O2 sats: stable throughout Complications: No apparent complications Patient did tolerate procedure well. Bilateral Breath Sounds: Clear, Diminished   Yes  Richmond CampbellHall, Elizah Lydon Lynn 06/18/2016, 9:34 AM

## 2016-06-18 NOTE — Progress Notes (Addendum)
[address]       Bryce Ramirez [id number]             [phone]      2 Days Post-Op Procedure(s) (LRB): CORONARY ARTERY BYPASS GRAFTING (CABG) x 3 (N/A) ENDOVEIN HARVEST OF GREATER SAPHENOUS VEIN (Left)   Subjective:  Extubated about an hour ago.  Some pain.. Continued cough with sputum production  Objective: Vital signs in last 24 hours: Temp:  [99.1 F (37.3 C)-100.6 F (38.1 C)] 99.1 F (37.3 C) (01/09 1000) Pulse Rate:  [79-179] 79 (01/09 1000) Cardiac Rhythm: Normal sinus rhythm (01/09 0800) Resp:  [0-28] 0 (01/09 1000) BP: (106-154)/(42-97) 114/68 (01/09 1000) SpO2:  [96 %-100 %] 97 % (01/09 1000) Arterial Line BP: (85-176)/(37-64) 105/48 (01/09 1000) FiO2 (%):  [40 %-50 %] 40 % (01/09 0842) Weight:  [199 lb 11.8 oz (90.6 kg)] 199 lb 11.8 oz (90.6 kg) (01/09 0550)  Hemodynamic parameters for last 24 hours: PAP: (27-42)/(10-23) 39/17 CO:  [3.9 L/min-4.8 L/min] 4.4 L/min CI:  [1.9 L/min/m2-2.4 L/min/m2] 2.2 L/min/m2  Intake/Output from previous day: 01/08 0701 - 01/09 0700 In: 2923.2 [I.V.:2313.2; NG/GT:210; IV Piggyback:400] Out: 2620 [Urine:1520; Emesis/NG output:600; Chest Tube:500] Intake/Output this shift: Total I/O In: 325.6 [I.V.:225.6; IV Piggyback:100] Out: 590 [Urine:540; Chest Tube:50]  General appearance: alert, cooperative and no distress Heart: regular rate and rhythm Lungs: diminished breath sounds bibasilar Abdomen: soft, non-tender; bowel sounds normal; no masses,  no organomegaly Extremities: edema trace Wound: clean and dry  Lab Results:  Recent Labs  06/17/16 1645 06/17/16 1654 06/18/16 0420  WBC 14.4*  --  12.0*  HGB 11.3* 11.2* 10.7*  HCT 33.4* 33.0* 31.6*  PLT 243  --  195   BMET:  Recent Labs  06/17/16 0600  06/17/16 1654 06/18/16 0420  NA 138  < > 140 138  K 4.1  < > 4.0 3.8  CL 105  < > 102 105  CO2 27  --   --  25  GLUCOSE 166*  < > 145* 138*  BUN 17  < > 17 15  CREATININE 0.93  < > 0.90  0.88  CALCIUM 7.5*  --   --  7.6*  < > = values in this interval not displayed.  PT/INR:  Recent Labs  06/17/16 0047  LABPROT 15.8*  INR 1.25   ABG    Component Value Date/Time   PHART 7.415 06/18/2016 0559   HCO3 25.6 06/18/2016 0559   TCO2 27 06/18/2016 0559   O2SAT 99.0 06/18/2016 0559   CBG (last 3)   Recent Labs  06/18/16 0500 06/18/16 0557 06/18/16 0648  GLUCAP 166* 150* 206*    Assessment/Plan: S/P Procedure(s) (LRB): CORONARY ARTERY BYPASS GRAFTING (CABG) x 3 (N/A) ENDOVEIN HARVEST OF GREATER SAPHENOUS VEIN (Left)  1. CV- NSR, Intra-balloon pump to remain in place today- on Milrinone, Epinephrine, BB given for HTN this morning- wean drips as tolerated,  2. Pulm- recently extubated, CXR with mild pulmonary edema, encourage IS... Leave chest tubes in place today 3. Renal- creatinine WNL, + hypervolemia. Lasix ordered this morning by Dr. Donata Clay 4. Expected post operative blood loss anemia- 10.7 5. DM- cbgs moderately controlled 6. Dispo- patient extubated this morning, wean drips as tolerated, watch HTN, continue diuretics, continue balloon pump today   LOS: 2 days    Ramirez, Bryce 06/18/2016  Extubate today, wean IABP to possibly remove wed PICC line for long milrinone wean patient examined and medical record reviewed,agree with  above note. Bryce Nationseter Van Trigt III 06/18/2016

## 2016-06-18 NOTE — Op Note (Signed)
NAME:  Bryce Ramirez, Bryce Ramirez                ACCOUNT NO.:  [id number]  MEDICAL RECORD NO.:  [id number]  LOCATION:  ED                           [facility]  PHYSICIAN:  Kerin PernaPeter Van Trigt, M.D.  DATE OF BIRTH:  1956-05-18  DATE OF PROCEDURE:  06/16/2016 DATE OF DISCHARGE:                              OPERATIVE REPORT   OPERATION: 1. Emergency coronary artery bypass grafting x3 (left internal mammary     artery to left anterior descending, saphenous vein graft to     circumflex marginal, saphenous vein graft to right coronary and     posterior descending). 2. Endoscopic harvest of left leg greater saphenous vein.  PREOPERATIVE DIAGNOSES:  Severe left main and three-vessel coronary artery disease, acute myocardial infarction, severe left ventricular dysfunction ejection fraction 15%, preoperative cardiogenic shock with preoperative balloon pump placed in the cath lab.  POSTOPERATIVE DIAGNOSES:  Severe left main and three-vessel coronary artery disease, acute myocardial infarction, severe left ventricular dysfunction ejection fraction 15%, preoperative cardiogenic shock with preoperative balloon pump placed in the cath lab.  ANESTHESIA:  General by Dr. Jairo Benarswell Jackson.  SURGEON:  Kerin PernaPeter Van Trigt, MD.  ASSISTANT:  Jari Favreessa Conte, PA-C.  CLINICAL NOTE:  The patient is a 61 year old type 1 diabetic, status post right BKA, who presented to the ED with chest pain, shortness of breath and was found to have an ST elevation MI with pulmonary edema on x-ray.  He was transferred directly to the cardiac cath lab where cardiac catheterization demonstrated severe 95-99% left main stenosis and 99% stenosis of the proximal RCA.  Ejection fraction was poor, 10- 20% and LVEDP was elevated at 27 mmHg.  The patient required 100% non- rebreather oxygen to maintain oxygen saturations 88-90%.  The patient's coronary anatomy was not favorable for percutaneous intervention and urgent CT surgical evaluation was  requested.  I examined the patient in the cath lab, reviewed the cardiac cath films with the patient's cardiologist, Dr. Clifton JamesMcAlhany.  It was the opinion of the patient's cardiologist that emergency CABG would be his best treatment and is only a realistic chance for long-term survival.  I agreed with that recommendation and discussed emergency CABG for treatment of his severe CAD with the patient in the cath lab.  I discussed the reasons for the surgery, the alternatives and the risks.  The patient understood that with his severe LV dysfunction, ongoing use of Plavix and poor hemodynamics that he would be a high risk for CABG, but that it would be his best long-term chance for long-term survival.  He understood the risk of stroke, bleeding, blood transfusion requirement, infection, renal failure, and death.  He provided informed consent.  OPERATIVE FINDINGS: 1. Severe LV dysfunction, which improved after surgical     revascularization. 2. Significant post pump coagulopathy related to his long-term use of     daily Plavix. 3. Adequate conduit from the left leg in which endoscopic technique     was used to harvest the saphenous vein. 4. Heavily diseased coronary arteries with a diffuse diabetic pattern     of disease.  OPERATIVE PROCEDURE:  The patient was brought directly from the cath lab to the operating room.  He was placed supine on the operating table and was carefully induced with general anesthesia.  He required epinephrine boluses to maintain blood pressure despite his balloon pump.  After being intubated, PA catheter was placed as well as radial A-line.  The patient was prepped and draped as a sterile field.  A proper time-out was performed.  A sternal incision was made and the saphenous vein was harvested endoscopically from the left leg.  The left internal mammary artery was harvested as a pedicle graft from its origin at the subclavian vessels.  It was a 1.4 mm vessel, but  with good flow.  The sternal retractor was placed and the pericardium was opened.  There was a moderate pericardial effusion which was drained.  This helped to improve his blood pressure.  Pursestrings were placed in the ascending aorta and right atrium and heparin was administered.  When the ACT was therapeutic, the patient was cannulated and placed on bypass.  The coronaries were identified for grafting and cardioplegia cannulas were placed for both antegrade and retrograde cold blood cardioplegia.  The patient was cooled to 30 degrees.  The mammary artery and veins were prepared for the distal anastomoses.  The crossclamp was applied.  A 1 L of cold blood cardioplegia was delivered in split doses between the antegrade aortic and retrograde coronary sinus catheters.  There was good cardioplegic arrest and septal temperature dropped less than 12 degrees.  Cardioplegia was delivered every 20 minutes.  The distal coronary anastomoses were performed.  The first distal anastomosis was to the posterior descending.  There was a 1.5-mm vessel with proximal 95% stenosis.  A reverse saphenous vein was sewn end-to- side with running 7-0 Prolene with good flow through the graft. Cardioplegia was redosed.  The second distal anastomosis was the circumflex marginal 2.  This was a large marginal branch off left coronary.  There was a proximal 99% stenosis.  A reverse saphenous vein was sewn end-to-side with running 7- 0 Prolene with good flow through the graft.  Cardioplegia was redosed.  The third distal anastomosis was to the mid LAD.  It was a 1.5-mm vessel.  There was a proximal 99% left main stenosis.  The left IMA pedicle was brought through an opening, and the left lateral pericardium was brought down onto the LAD and sewn end-to-side with running 8-0 Prolene.  There was good flow through the anastomosis after briefly releasing the bulldog off the pedicle.  The bulldog was then reapplied to  the pedicle and the pedicle was secured to the epicardium with 6-0 Prolene.  Cardioplegia was redosed.  The proximal 2 vein anastomoses were performed but the crossclamp was still in place using a 4.5-mm punch running 6-0 Prolene.  Air was vented from the coronaries with a dose of retrograde warm blood cardioplegia and the crossclamp was removed.  The heart resumed a spontaneous rhythm.  The vein grafts were de-aired and opened.  Each had good flow and hemostasis was documented at the proximal and distal sites.  The cardioplegia cannulas removed. Temporary pacing wires were applied.  The patient was rewarmed and reperfused.  The lungs were expanded.  It should be noted that a large volume, 500 mL of pleural effusion was removed from the left chest and large volume of pleural effusion-800 mL was removed from the right chest from chronic heart failure.  The patient started on epinephrine, dopamine, and balloon pump one-to- one augmentation.  He was carefully weaned off cardiopulmonary bypass. Echo showed gradual  but progressive improvement in almost all areas of his left ventricle with improved contractility.  There was no significant mitral regurgitation either before or after revascularization.  The patient was atrially paced with stable hemodynamics and received protamine without adverse reaction.  The patient had significant coagulopathy from his ongoing Plavix use and required platelet and FFP transfusion to achieve adequate hemostasis.  Bilateral pleural tubes and anterior mediastinal chest tubes were placed and brought out through separate incisions.  The sternum was then closed with interrupted wire.  The patient remained stable.  The pectoralis fascia was closed with a running #1 Vicryl.  The subcutaneous and skin layers were closed with a running Vicryl and sterile dressings were applied.  Total cardiopulmonary bypass time was 120 minutes.     Kerin Perna,  M.D.     PV/MEDQ  D:  06/17/2016  T:  06/18/2016  Job:  [id number]

## 2016-06-19 ENCOUNTER — Inpatient Hospital Stay (HOSPITAL_COMMUNITY): Payer: BLUE CROSS/BLUE SHIELD

## 2016-06-19 LAB — CULTURE, RESPIRATORY W GRAM STAIN
Culture: NORMAL
Special Requests: NORMAL

## 2016-06-19 LAB — COMPREHENSIVE METABOLIC PANEL
ALT: 38 U/L (ref 17–63)
AST: 65 U/L — ABNORMAL HIGH (ref 15–41)
Albumin: 2.5 g/dL — ABNORMAL LOW (ref 3.5–5.0)
Alkaline Phosphatase: 86 U/L (ref 38–126)
Anion gap: 7 (ref 5–15)
BUN: 17 mg/dL (ref 6–20)
CO2: 27 mmol/L (ref 22–32)
Calcium: 7.8 mg/dL — ABNORMAL LOW (ref 8.9–10.3)
Chloride: 103 mmol/L (ref 101–111)
Creatinine, Ser: 0.72 mg/dL (ref 0.61–1.24)
GFR calc Af Amer: >60 mL/min (ref >60)
GFR calc non Af Amer: >60 mL/min (ref >60)
Glucose, Bld: 160 mg/dL — ABNORMAL HIGH (ref 65–99)
Potassium: 4.2 mmol/L (ref 3.5–5.1)
Sodium: 137 mmol/L (ref 135–145)
Total Bilirubin: 0.3 mg/dL (ref 0.3–1.2)
Total Protein: 5.5 g/dL — ABNORMAL LOW (ref 6.5–8.1)

## 2016-06-19 LAB — CBC
HCT: 33.9 % — ABNORMAL LOW (ref 39.0–52.0)
Hemoglobin: 10.9 g/dL — ABNORMAL LOW (ref 13.0–17.0)
MCH: 29.1 pg (ref 26.0–34.0)
MCHC: 32.2 g/dL (ref 30.0–36.0)
MCV: 90.4 fL (ref 78.0–100.0)
Platelets: 169 10*3/uL (ref 150–400)
RBC: 3.75 MIL/uL — ABNORMAL LOW (ref 4.22–5.81)
RDW: 13.7 % (ref 11.5–15.5)
WBC: 9.3 10*3/uL (ref 4.0–10.5)

## 2016-06-19 LAB — GLUCOSE, CAPILLARY
GLUCOSE-CAPILLARY: 185 mg/dL — AB (ref 65–99)
GLUCOSE-CAPILLARY: 189 mg/dL — AB (ref 65–99)
Glucose-Capillary: 144 mg/dL — ABNORMAL HIGH (ref 65–99)
Glucose-Capillary: 206 mg/dL — ABNORMAL HIGH (ref 65–99)
Glucose-Capillary: 178 mg/dL — ABNORMAL HIGH (ref 65–99)
Glucose-Capillary: 88 mg/dL (ref 65–99)

## 2016-06-19 LAB — COOXEMETRY PANEL
Carboxyhemoglobin: 0.8 % (ref 0.5–1.5)
Methemoglobin: 1.2 % (ref 0.0–1.5)
O2 Saturation: 51.1 %
Total hemoglobin: 10.9 g/dL — ABNORMAL LOW (ref 12.0–16.0)

## 2016-06-19 LAB — POCT I-STAT, CHEM 8
BUN: 23 mg/dL — AB (ref 6–20)
CREATININE: 0.8 mg/dL (ref 0.61–1.24)
Calcium, Ion: 1.14 mmol/L — ABNORMAL LOW (ref 1.15–1.40)
Chloride: 97 mmol/L — ABNORMAL LOW (ref 101–111)
GLUCOSE: 203 mg/dL — AB (ref 65–99)
HEMATOCRIT: 30 % — AB (ref 39.0–52.0)
Hemoglobin: 10.2 g/dL — ABNORMAL LOW (ref 13.0–17.0)
POTASSIUM: 4.1 mmol/L (ref 3.5–5.1)
Sodium: 136 mmol/L (ref 135–145)
TCO2: 30 mmol/L (ref 0–100)

## 2016-06-19 LAB — POCT ACTIVATED CLOTTING TIME
Activated Clotting Time: 241 seconds
Activated Clotting Time: 125 seconds

## 2016-06-19 LAB — APTT: aPTT: 28 seconds (ref 24–36)

## 2016-06-19 LAB — PROTIME-INR
INR: 1.2
Prothrombin Time: 15.3 seconds — ABNORMAL HIGH (ref 11.4–15.2)

## 2016-06-19 MED ORDER — FUROSEMIDE 10 MG/ML IJ SOLN
20.0000 mg | Freq: Four times a day (QID) | INTRAMUSCULAR | Status: DC
  Administered 2016-06-19 – 2016-06-20 (×4): 20 mg via INTRAVENOUS
  Filled 2016-06-19 (×4): qty 2

## 2016-06-19 MED ORDER — DULOXETINE HCL 60 MG PO CPEP
90.0000 mg | ORAL_CAPSULE | Freq: Every day | ORAL | Status: DC
  Administered 2016-06-19 – 2016-06-27 (×9): 90 mg via ORAL
  Filled 2016-06-19 (×9): qty 1

## 2016-06-19 MED ORDER — GABAPENTIN 300 MG PO CAPS
300.0000 mg | ORAL_CAPSULE | Freq: Two times a day (BID) | ORAL | Status: DC
  Administered 2016-06-19 – 2016-06-27 (×17): 300 mg via ORAL
  Filled 2016-06-19 (×17): qty 1

## 2016-06-19 MED ORDER — VITAMIN B-1 100 MG PO TABS
100.0000 mg | ORAL_TABLET | Freq: Every day | ORAL | Status: DC
  Administered 2016-06-19 – 2016-06-27 (×9): 100 mg via ORAL
  Filled 2016-06-19 (×9): qty 1

## 2016-06-19 MED ORDER — MILRINONE LACTATE IN DEXTROSE 20-5 MG/100ML-% IV SOLN
0.1250 ug/kg/min | INTRAVENOUS | Status: DC
  Administered 2016-06-19 – 2016-06-20 (×3): 0.3 ug/kg/min via INTRAVENOUS
  Administered 2016-06-22: 0.125 ug/kg/min via INTRAVENOUS
  Administered 2016-06-22: 0.25 ug/kg/min via INTRAVENOUS
  Filled 2016-06-19 (×7): qty 100

## 2016-06-19 MED ORDER — GUAIFENESIN ER 600 MG PO TB12
600.0000 mg | ORAL_TABLET | Freq: Two times a day (BID) | ORAL | Status: DC
  Administered 2016-06-19 – 2016-06-27 (×17): 600 mg via ORAL
  Filled 2016-06-19 (×18): qty 1

## 2016-06-19 MED ORDER — LISINOPRIL 5 MG PO TABS
5.0000 mg | ORAL_TABLET | Freq: Every day | ORAL | Status: DC
  Administered 2016-06-19 – 2016-06-26 (×8): 5 mg via ORAL
  Filled 2016-06-19 (×4): qty 2
  Filled 2016-06-19: qty 1
  Filled 2016-06-19: qty 2
  Filled 2016-06-19: qty 1
  Filled 2016-06-19: qty 2

## 2016-06-19 MED ORDER — BUPROPION HCL ER (SR) 100 MG PO TB12
100.0000 mg | ORAL_TABLET | Freq: Every day | ORAL | Status: DC
  Administered 2016-06-19 – 2016-06-27 (×9): 100 mg via ORAL
  Filled 2016-06-19 (×9): qty 1

## 2016-06-19 MED ORDER — SODIUM CHLORIDE 0.9% FLUSH
10.0000 mL | Freq: Two times a day (BID) | INTRAVENOUS | Status: DC
  Administered 2016-06-19 – 2016-06-25 (×10): 10 mL

## 2016-06-19 MED ORDER — MIDAZOLAM HCL 2 MG/2ML IJ SOLN: 1.0000 mg | INTRAMUSCULAR | Status: DC | PRN

## 2016-06-19 MED ORDER — SODIUM CHLORIDE 0.9% FLUSH
10.0000 mL | INTRAVENOUS | Status: DC | PRN
  Administered 2016-06-25 – 2016-06-26 (×2): 10 mL
  Filled 2016-06-19 (×2): qty 40

## 2016-06-19 MED ORDER — PANTOPRAZOLE SODIUM 40 MG PO TBEC
40.0000 mg | DELAYED_RELEASE_TABLET | Freq: Every day | ORAL | Status: DC
  Administered 2016-06-19 – 2016-06-27 (×9): 40 mg via ORAL
  Filled 2016-06-19 (×9): qty 1

## 2016-06-19 MED ORDER — ENOXAPARIN SODIUM 40 MG/0.4ML ~~LOC~~ SOLN
40.0000 mg | SUBCUTANEOUS | Status: DC
  Administered 2016-06-19 – 2016-06-26 (×8): 40 mg via SUBCUTANEOUS
  Filled 2016-06-19 (×8): qty 0.4

## 2016-06-19 NOTE — Progress Notes (Signed)
Order for BALLOON PUMP sheath removal verified per post procedural orders. Procedure explained to patient and RIGHT FEMORAL artery access site assessed: level 0, DOPPLER POPLITEAL PULSE +. 7.5 French Sheath removed and manual pressure applied for 30 minutes. Pre, peri, & post procedural vitals: HR 80, RR 16, O2 Sat 99%, BP 155/80, Pain level 0. Distal pulses remained intact after sheath removal. Access site level 0 and dressed with 4X4 gauze and tegaderm.  HEATHER BOWMAN, RN confirmed condition of site. Post procedural instructions discussed with return demonstration from patient.

## 2016-06-19 NOTE — Anesthesia Postprocedure Evaluation (Signed)
Anesthesia Post Note  Patient: Bryce Ramirez  Procedure(s) Performed: Procedure(s) (LRB): CORONARY ARTERY BYPASS GRAFTING (CABG) x 3 (N/A) ENDOVEIN HARVEST OF GREATER SAPHENOUS VEIN (Left)  Patient location during evaluation: SICU Anesthesia Type: General Level of consciousness: awake and alert, patient cooperative and oriented Pain management: pain level controlled Vital Signs Assessment: post-procedure vital signs reviewed and stable Respiratory status: spontaneous breathing, nonlabored ventilation, respiratory function stable and patient connected to nasal cannula oxygen Cardiovascular status: blood pressure returned to baseline Postop Assessment: no signs of nausea or vomiting and adequate PO intake Anesthetic complications: no Comments: Pt continuing to improve daily       Last Vitals:  Vitals:   06/19/16 1900 06/19/16 1931  BP: 133/70   Pulse: 89   Resp: 19   Temp:  36.6 C    Last Pain:  Vitals:   06/19/16 1931  TempSrc: Oral  PainSc:                  Nattaly Yebra,E. Bindi Klomp

## 2016-06-19 NOTE — Progress Notes (Signed)
[facility] BRIEF SICU PROGRESS NOTE  3 Days Post-Op  S/P Procedure(s) (LRB): CORONARY ARTERY BYPASS GRAFTING (CABG) x 3 (N/A) ENDOVEIN HARVEST OF GREATER SAPHENOUS VEIN (Left)   Stable day NSR w/ PAC's IABP out, Aline out  Breathing comfortably w/ O2 sats 91-98% on 3 L/min Diuresing well   Plan: Continue current plan  Purcell Nailslarence H Isaiah Torok, MD 06/19/2016 7:23 PM

## 2016-06-19 NOTE — Progress Notes (Signed)
Inpatient Diabetes Program Recommendations  AACE/ADA: New Consensus Statement on Inpatient Glycemic Control (2015)  Target Ranges:  Prepandial:   less than 140 mg/dL      Peak postprandial:   less than 180 mg/dL (1-2 hours)      Critically ill patients:  140 - 180 mg/dL   Lab Results  Component Value Date   GLUCAP 189 (H) 06/19/2016    Review of Glycemic Control:  Results for Bryce Ramirez, Bryce Ramirez (MRN [id number]) as of 06/19/2016 10:51  Ref. Range 06/18/2016 19:56 06/18/2016 23:54 06/19/2016 04:00 06/19/2016 06:52 06/19/2016 07:45  Glucose-Capillary Latest Ref Range: 65 - 99 mg/dL 409125 (H) 811102 (H) 914144 (H) 185 (H) 189 (H)   Diabetes history: Type 1 diabetes since age 61 Outpatient Diabetes medications: Insulin pump Current orders for Inpatient glycemic control:  Levemir 20 units bid, TCTS q 4 hours  Inpatient Diabetes Program Recommendations:   Agree with basal dose based on patients insulin pump settings.  May consider reduction of Novolog correction to sensitive tid with meals and add Novolog meal coverage 5 units tid with meals . Patient stated yesterday that he would prefer to not be on insulin pump during hospitalization.   Thanks, Beryl MeagerJenny Palin Tristan, RN, BC-ADM Inpatient Diabetes Coordinator Pager [phone] (8a-5p)

## 2016-06-19 NOTE — Progress Notes (Signed)
DC art line, swan and introducer per MD verbal order. Double lumen PICC placed earlier.    Hermina BartersBOWMAN, Arzella Rehmann M, RN

## 2016-06-19 NOTE — Progress Notes (Signed)
ACT 241, will need to be less than 150 for cath lab to pull IABP. Notified MD, order was written for 1 Plasma and recheck ACT.   Hermina BartersBOWMAN, Abria Vannostrand M, RN

## 2016-06-19 NOTE — Progress Notes (Signed)
IABP DC'd per MD order and protocol, patient on bedrest for 6 hours-bedrest will be up at 2015. Patient currently resting with call bell in site. Education provided to patient on activity post IABP removal. Will continue to monitor.  Hermina BartersBOWMAN, Chryl Holten M, RN

## 2016-06-19 NOTE — Progress Notes (Signed)
Peripherally Inserted Central Catheter/Midline Placement  The IV Nurse has discussed with the patient and/or persons authorized to consent for the patient, the purpose of this procedure and the potential benefits and risks involved with this procedure.  The benefits include less needle sticks, lab draws from the catheter, and the patient may be discharged home with the catheter. Risks include, but not limited to, infection, bleeding, blood clot (thrombus formation), and puncture of an artery; nerve damage and irregular heartbeat and possibility to perform a PICC exchange if needed/ordered by physician.  Alternatives to this procedure were also discussed.  Bard Power PICC patient education guide, fact sheet on infection prevention and patient information card has been provided to patient /or left at bedside.    PICC/Midline Placement Documentation        Timmothy Soursewman, Ollin Hochmuth Renee 06/19/2016, 12:36 PM

## 2016-06-19 NOTE — Progress Notes (Signed)
eLink Physician-Brief Progress Note Patient Name: Bryce Ramirez DOB: 06/06/1956 MRN: [id number]   Date of Service  06/19/2016  HPI/Events of Note  Camera check on patient postextubation. Lights off. Nurse at bedside. No distress.   eICU Interventions  Continuing ICU monitoring.      Intervention Category Major Interventions: Respiratory failure - evaluation and management  Lawanda CousinsJennings [name] 06/19/2016, 1:17 AM

## 2016-06-19 NOTE — Progress Notes (Signed)
3 Days Post-Op Procedure(s) (LRB): CORONARY ARTERY BYPASS GRAFTING (CABG) x 3 (N/A) ENDOVEIN HARVEST OF GREATER SAPHENOUS VEIN (Left) Subjective: Emergency CABG 3 for ischemic cardiomyopathy, acute MI, cardiogenic shock with preoperative balloon pump Patient extubated yesterday and balloon pump weaned 1-3 Hemodynamics remained stable, neuro intact, urine output adequate Chest tube drainage remains significant and patient has not been dangled so will leave chest tubes in place  Plan-removed balloon pump and and 6 hours dangle patient, removed PA catheter and mobilize out of bed to chair Continue twice a day Lasix, continue milrinone 0.3 for ischemic cardiomyopathy EF 15% Objective: Vital signs in last 24 hours: Temp:  [98.1 F (36.7 C)-99.1 F (37.3 C)] 98.4 F (36.9 C) (01/10 1100) Pulse Rate:  [47-189] 91 (01/10 1100) Cardiac Rhythm: Normal sinus rhythm (01/10 0730) Resp:  [8-27] 26 (01/10 1100) BP: (105-153)/(48-76) 153/63 (01/10 1100) SpO2:  [88 %-100 %] 95 % (01/10 1100) Arterial Line BP: (112-187)/(39-109) 148/68 (01/10 1100) Weight:  [198 lb 10.2 oz (90.1 kg)] 198 lb 10.2 oz (90.1 kg) (01/10 0500)  Hemodynamic parameters for last 24 hours: PAP: (32-51)/(13-36) 39/27 CO:  [4.4 L/min-5.3 L/min] 5.1 L/min CI:  [2.1 L/min/m2-2.6 L/min/m2] 2.5 L/min/m2  Intake/Output from previous day: 01/09 0701 - 01/10 0700 In: 2032.9 [P.O.:420; I.V.:1462.9; IV Piggyback:150] Out: 2805 [Urine:1935; Chest Tube:870] Intake/Output this shift: Total I/O In: 518.4 [I.V.:230.4; Blood:238; IV Piggyback:50] Out: 965 [Urine:775; Chest Tube:190]       Exam    General- alert and comfortable   Lungs- clear without rales, wheezes   Cor- regular rate and rhythm, no murmur , gallop   Abdomen- soft, non-tender   Extremities - warm, non-tender, minimal edema   Neuro- oriented, appropriate, no focal weakness   Lab Results:  Recent Labs  06/18/16 0420 06/18/16 1641 06/19/16 0400  WBC 12.0*   --  9.3  HGB 10.7* 10.5* 10.9*  HCT 31.6* 31.0* 33.9*  PLT 195  --  169   BMET:  Recent Labs  06/18/16 0420 06/18/16 1641 06/19/16 0400  NA 138 138 137  K 3.8 4.1 4.2  CL 105 101 103  CO2 25  --  27  GLUCOSE 138* 120* 160*  BUN 15 18 17   CREATININE 0.88 0.70 0.72  CALCIUM 7.6*  --  7.8*    PT/INR:  Recent Labs  06/19/16 0918  LABPROT 15.3*  INR 1.20   ABG    Component Value Date/Time   PHART 7.383 06/18/2016 1637   HCO3 26.0 06/18/2016 1637   TCO2 27 06/18/2016 1641   O2SAT 51.1 06/19/2016 0400   CBG (last 3)   Recent Labs  06/19/16 0400 06/19/16 0652 06/19/16 0745  GLUCAP 144* 185* 189*    Assessment/Plan: S/P Procedure(s) (LRB): CORONARY ARTERY BYPASS GRAFTING (CABG) x 3 (N/A) ENDOVEIN HARVEST OF GREATER SAPHENOUS VEIN (Left) Mobilize Diuresis Diabetes control See progression orders   LOS: 3 days    Bryce Ramirez 06/19/2016

## 2016-06-20 ENCOUNTER — Inpatient Hospital Stay (HOSPITAL_COMMUNITY): Payer: BLUE CROSS/BLUE SHIELD

## 2016-06-20 DIAGNOSIS — I255 Ischemic cardiomyopathy: Secondary | ICD-10-CM

## 2016-06-20 DIAGNOSIS — E119 Type 2 diabetes mellitus without complications: Secondary | ICD-10-CM

## 2016-06-20 DIAGNOSIS — I2101 ST elevation (STEMI) myocardial infarction involving left main coronary artery: Secondary | ICD-10-CM

## 2016-06-20 LAB — CBC
HCT: 31.8 % — ABNORMAL LOW (ref 39.0–52.0)
Hemoglobin: 10.3 g/dL — ABNORMAL LOW (ref 13.0–17.0)
MCH: 28.9 pg (ref 26.0–34.0)
MCHC: 32.4 g/dL (ref 30.0–36.0)
MCV: 89.1 fL (ref 78.0–100.0)
Platelets: 211 10*3/uL (ref 150–400)
RBC: 3.57 MIL/uL — ABNORMAL LOW (ref 4.22–5.81)
RDW: 13.3 % (ref 11.5–15.5)
WBC: 10.6 10*3/uL — ABNORMAL HIGH (ref 4.0–10.5)

## 2016-06-20 LAB — PREPARE FRESH FROZEN PLASMA
UNIT DIVISION: 0
Unit division: 0
Unit division: 0
Unit division: 0

## 2016-06-20 LAB — POCT I-STAT, CHEM 8
BUN: 31 mg/dL — AB (ref 6–20)
CHLORIDE: 92 mmol/L — AB (ref 101–111)
CREATININE: 1 mg/dL (ref 0.61–1.24)
Calcium, Ion: 1.13 mmol/L — ABNORMAL LOW (ref 1.15–1.40)
GLUCOSE: 287 mg/dL — AB (ref 65–99)
HCT: 33 % — ABNORMAL LOW (ref 39.0–52.0)
Hemoglobin: 11.2 g/dL — ABNORMAL LOW (ref 13.0–17.0)
POTASSIUM: 4 mmol/L (ref 3.5–5.1)
Sodium: 138 mmol/L (ref 135–145)
TCO2: 32 mmol/L (ref 0–100)

## 2016-06-20 LAB — TYPE AND SCREEN
BLOOD PRODUCT EXPIRATION DATE: 201801252359
BLOOD PRODUCT EXPIRATION DATE: 201801252359
Blood Product Expiration Date: 201801252359
Blood Product Expiration Date: 201801262359
ISSUE DATE / TIME: 201801071731
ISSUE DATE / TIME: 201801071731
ISSUE DATE / TIME: 201801071731
ISSUE DATE / TIME: 201801071731
UNIT TYPE AND RH: 7300
UNIT TYPE AND RH: 7300
UNIT TYPE AND RH: 7300
Unit Type and Rh: 7300

## 2016-06-20 LAB — COMPREHENSIVE METABOLIC PANEL
ALT: 37 U/L (ref 17–63)
AST: 40 U/L (ref 15–41)
Albumin: 2.4 g/dL — ABNORMAL LOW (ref 3.5–5.0)
Alkaline Phosphatase: 105 U/L (ref 38–126)
Anion gap: 8 (ref 5–15)
BUN: 21 mg/dL — ABNORMAL HIGH (ref 6–20)
CO2: 31 mmol/L (ref 22–32)
Calcium: 8 mg/dL — ABNORMAL LOW (ref 8.9–10.3)
Chloride: 99 mmol/L — ABNORMAL LOW (ref 101–111)
Creatinine, Ser: 0.79 mg/dL (ref 0.61–1.24)
GFR calc Af Amer: >60 mL/min (ref >60)
GFR calc non Af Amer: >60 mL/min (ref >60)
Glucose, Bld: 111 mg/dL — ABNORMAL HIGH (ref 65–99)
Potassium: 3.8 mmol/L (ref 3.5–5.1)
Sodium: 138 mmol/L (ref 135–145)
Total Bilirubin: 0.5 mg/dL (ref 0.3–1.2)
Total Protein: 5.5 g/dL — ABNORMAL LOW (ref 6.5–8.1)

## 2016-06-20 LAB — GLUCOSE, CAPILLARY
GLUCOSE-CAPILLARY: 103 mg/dL — AB (ref 65–99)
GLUCOSE-CAPILLARY: 162 mg/dL — AB (ref 65–99)
Glucose-Capillary: 93 mg/dL (ref 65–99)
Glucose-Capillary: 94 mg/dL (ref 65–99)
Glucose-Capillary: 217 mg/dL — ABNORMAL HIGH (ref 65–99)
Glucose-Capillary: 189 mg/dL — ABNORMAL HIGH (ref 65–99)

## 2016-06-20 LAB — COOXEMETRY PANEL
Carboxyhemoglobin: 0.8 % (ref 0.5–1.5)
Methemoglobin: 1.2 % (ref 0.0–1.5)
O2 Saturation: 54.5 %
Total hemoglobin: 10.5 g/dL — ABNORMAL LOW (ref 12.0–16.0)

## 2016-06-20 MED ORDER — INSULIN DETEMIR 100 UNIT/ML ~~LOC~~ SOLN
12.0000 [IU] | Freq: Two times a day (BID) | SUBCUTANEOUS | Status: DC
  Administered 2016-06-20: 12 [IU] via SUBCUTANEOUS
  Filled 2016-06-20 (×2): qty 0.12

## 2016-06-20 MED ORDER — INSULIN DETEMIR 100 UNIT/ML ~~LOC~~ SOLN
18.0000 [IU] | Freq: Two times a day (BID) | SUBCUTANEOUS | Status: DC
  Administered 2016-06-20 – 2016-06-21 (×2): 18 [IU] via SUBCUTANEOUS
  Filled 2016-06-20 (×3): qty 0.18

## 2016-06-20 MED ORDER — FUROSEMIDE 10 MG/ML IJ SOLN
40.0000 mg | Freq: Two times a day (BID) | INTRAMUSCULAR | Status: DC
  Administered 2016-06-20: 40 mg via INTRAVENOUS
  Administered 2016-06-20: 20 mg via INTRAVENOUS
  Administered 2016-06-21 – 2016-06-23 (×6): 40 mg via INTRAVENOUS
  Filled 2016-06-20 (×8): qty 4

## 2016-06-20 MED ORDER — INSULIN ASPART 100 UNIT/ML ~~LOC~~ SOLN
0.0000 [IU] | Freq: Every day | SUBCUTANEOUS | Status: DC
  Administered 2016-06-22: 2 [IU] via SUBCUTANEOUS

## 2016-06-20 MED ORDER — INSULIN ASPART 100 UNIT/ML ~~LOC~~ SOLN
3.0000 [IU] | Freq: Three times a day (TID) | SUBCUTANEOUS | Status: DC
  Administered 2016-06-20 – 2016-06-23 (×8): 3 [IU] via SUBCUTANEOUS

## 2016-06-20 MED ORDER — INSULIN DETEMIR 100 UNIT/ML ~~LOC~~ SOLN
22.0000 [IU] | Freq: Two times a day (BID) | SUBCUTANEOUS | Status: DC
  Filled 2016-06-20: qty 0.22

## 2016-06-20 MED ORDER — INSULIN ASPART 100 UNIT/ML ~~LOC~~ SOLN
0.0000 [IU] | Freq: Three times a day (TID) | SUBCUTANEOUS | Status: DC
Start: 2016-06-20 — End: 2016-06-27
  Administered 2016-06-20: 5 [IU] via SUBCUTANEOUS
  Administered 2016-06-21: 2 [IU] via SUBCUTANEOUS
  Administered 2016-06-21: 3 [IU] via SUBCUTANEOUS
  Administered 2016-06-22: 7 [IU] via SUBCUTANEOUS
  Administered 2016-06-22: 2 [IU] via SUBCUTANEOUS
  Administered 2016-06-23: 1 [IU] via SUBCUTANEOUS
  Administered 2016-06-23 (×2): 5 [IU] via SUBCUTANEOUS
  Administered 2016-06-24: 3 [IU] via SUBCUTANEOUS
  Administered 2016-06-24 (×2): 1 [IU] via SUBCUTANEOUS
  Administered 2016-06-25: 5 [IU] via SUBCUTANEOUS
  Administered 2016-06-26: 1 [IU] via SUBCUTANEOUS
  Administered 2016-06-26: 3 [IU] via SUBCUTANEOUS
  Administered 2016-06-26: 1 [IU] via SUBCUTANEOUS
  Administered 2016-06-27: 9 [IU] via SUBCUTANEOUS

## 2016-06-20 NOTE — Progress Notes (Signed)
Inpatient Diabetes Program Recommendations  AACE/ADA: New Consensus Statement on Inpatient Glycemic Control (2015)  Target Ranges:  Prepandial:   less than 140 mg/dL      Peak postprandial:   less than 180 mg/dL (1-2 hours)      Critically ill patients:  140 - 180 mg/dL   Lab Results  Component Value Date   GLUCAP 94 06/20/2016    Review of Glycemic Control:  Results for [name], [name] (MRN [id number]) as of 06/20/2016 10:41  Ref. Range 06/19/2016 16:03 06/19/2016 19:22 06/20/2016 03:56 06/20/2016 08:13  Glucose-Capillary Latest Ref Range: 65 - 99 mg/dL 952178 (H) 88 93 94   Diabetes history: Type 1 diabetes Inpatient Diabetes Program Recommendations:   Note BG's less than 100 mg/dL this morning.  Consider reducing Novolog correction to sensitive tid with meals and HS.  Also due to history of Type 1 diabetes, patient will need CHO meal coverage 3 units tid with meals.  Thanks, Beryl MeagerJenny Henning Ehle, RN, BC-ADM Inpatient Diabetes Coordinator Pager [phone] (8a-5p)

## 2016-06-20 NOTE — Evaluation (Signed)
Physical Therapy Evaluation Patient Details Name: Bryce Ramirez MRN: [id number] DOB: 05/08/1956 Today's Date: 06/20/2016   History of Present Illness  61 yo with STEMI s/p emergent CABGx3. PMhx: Rt BKA, DM, HTN, CAD  Clinical Impression  Pt pleasant but with decreased problem solving, awareness, functional activity, mobility and gait who will benefit from acute therapy to address above and below deficits to maximize mobility and independence. Pt with RLE too edematous for prosthesis to fit properly and currently impairing ability to ambulate, shrinker applied end of session in bed with RN made aware. Pt educated for sternal precautions, transfers, gait and function with inability to recall any precautions end of session despite cueing and multiple education attempts throughout session.   HR 94 sats 95% on 3L BP pre 117/75 Post 133/63    Follow Up Recommendations SNF;Supervision/Assistance - 24 hour    Equipment Recommendations  Rolling walker with 5" wheels    Recommendations for Other Services OT consult     Precautions / Restrictions Precautions Precautions: Fall      Mobility  Bed Mobility Overal bed mobility: Needs Assistance Bed Mobility: Supine to Sit     Supine to sit: Min assist     General bed mobility comments: cues for sequence and precautions  Transfers Overall transfer level: Needs assistance   Transfers: Sit to/from Stand Sit to Stand: Mod assist;+2 physical assistance;+2 safety/equipment         General transfer comment: cues for hand placement and sequence, prosthesis in place but unable to click in and pt still able to place weight through bil LE with hands on thighs. assist for anterior translation and rise  Ambulation/Gait Ambulation/Gait assistance: Min assist;+2 safety/equipment Ambulation Distance (Feet): 5 Feet Assistive device: Rolling walker (2 wheeled) Gait Pattern/deviations: Step-to pattern   Gait velocity interpretation: Below normal  speed for age/gender General Gait Details: pt side stepping bed to chair with RW with 2 person assist and cues, limited by prosthesis not fitting, cues for sequence and safety  Stairs            Wheelchair Mobility    Modified Rankin (Stroke Patients Only)       Balance Overall balance assessment: Needs assistance   Sitting balance-Leahy Scale: Fair       Standing balance-Leahy Scale: Poor                               Pertinent Vitals/Pain Pain Assessment: No/denies pain    Home Living Family/patient expects to be discharged to:: Skilled nursing facility Living Arrangements: Alone Available Help at Discharge: Friend(s);Available PRN/intermittently Type of Home: House Home Access: Stairs to enter   Entrance Stairs-Number of Steps: 1 Home Layout: One level Home Equipment: Cane - single point;Wheelchair - manual;Tub bench      Prior Function Level of Independence: Independent with assistive device(s)         Comments: pt normally walks with a cane and prosthesis     Hand Dominance        Extremity/Trunk Assessment   Upper Extremity Assessment Upper Extremity Assessment: Overall WFL for tasks assessed    Lower Extremity Assessment Lower Extremity Assessment: RLE deficits/detail RLE Deficits / Details: BKA with edema and unable to don prosthesis    Cervical / Trunk Assessment Cervical / Trunk Assessment: Normal  Communication   Communication: No difficulties  Cognition Arousal/Alertness: Awake/alert Behavior During Therapy: WFL for tasks assessed/performed Overall Cognitive Status: Impaired/Different from baseline Area  of Impairment: Attention;Memory;Following commands;Safety/judgement;Problem solving   Current Attention Level: Sustained Memory: Decreased short-term memory;Decreased recall of precautions Following Commands: Follows one step commands inconsistently Safety/Judgement: Decreased awareness of safety;Decreased awareness  of deficits   Problem Solving: Slow processing;Decreased initiation;Requires verbal cues General Comments: pt with constant cues not to push, difficulty maintaining eyes open in bed, pt unable to process how to don prosthesis or fact that leg too edematous to fit    General Comments      Exercises     Assessment/Plan    PT Assessment Patient needs continued PT services  PT Problem List Decreased mobility;Decreased activity tolerance;Decreased balance;Decreased knowledge of use of DME;Decreased knowledge of precautions;Decreased cognition          PT Treatment Interventions Gait training;Therapeutic exercise;DME instruction;Therapeutic activities;Patient/family education;Balance training;Functional mobility training    PT Goals (Current goals can be found in the Care Plan section)  Acute Rehab PT Goals Patient Stated Goal: return home PT Goal Formulation: With patient Time For Goal Achievement: 07/04/16 Potential to Achieve Goals: Good    Frequency Min 3X/week   Barriers to discharge Decreased caregiver support      Co-evaluation               End of Session Equipment Utilized During Treatment: Gait belt;Oxygen Activity Tolerance: Patient tolerated treatment well Patient left: in bed;with call bell/phone within reach;with bed alarm set;with nursing/sitter in room Nurse Communication: Mobility status;Precautions         Time: 1610-9604 PT Time Calculation (min) (ACUTE ONLY): 23 min   Charges:   PT Evaluation $PT Eval Moderate Complexity: 1 Procedure PT Treatments $Therapeutic Activity: 8-22 mins   PT G Codes:        Breda Bond B Dione Mccombie 07/06/16, 9:19 AM Delaney Meigs, PT [phone]

## 2016-06-20 NOTE — Progress Notes (Signed)
Patient Name: Bryce Ramirez Date of Encounter: 06/20/2016  [facility]  Hospital Problem List     Active Problems:   ST elevation myocardial infarction (STEMI) (HCC)   S/P CABG x 3    Subjective   Intubated, sedated.   Inpatient Medications    Scheduled Meds: . acetaminophen  1,000 mg Oral Q6H   Or  . acetaminophen (TYLENOL) oral liquid 160 mg/5 mL  1,000 mg Per Tube Q6H  . aspirin EC  325 mg Oral Daily   Or  . aspirin  324 mg Per Tube Daily  . bisacodyl  10 mg Oral Daily   Or  . bisacodyl  10 mg Rectal Daily  . buPROPion  100 mg Oral Daily  . docusate sodium  200 mg Oral Daily  . DULoxetine  90 mg Oral Daily  . enoxaparin (LOVENOX) injection  40 mg Subcutaneous Q24H  . famotidine (PEPCID) IV  20 mg Intravenous Q12H  . fentaNYL  50 mcg Transdermal Q72H  . furosemide  40 mg Intravenous BID  . gabapentin  300 mg Oral BID  . guaiFENesin  600 mg Oral BID  . insulin aspart  0-24 Units Subcutaneous Q4H  . insulin detemir  12 Units Subcutaneous BID  . lisinopril  5 mg Oral Daily  . mouth rinse  15 mL Mouth Rinse q12n4p  . metoCLOPramide (REGLAN) injection  10 mg Intravenous Q6H  . metoprolol tartrate  12.5 mg Oral BID   Or  . metoprolol tartrate  12.5 mg Per Tube BID  . pantoprazole  40 mg Oral Q1200  . [START ON 06/21/2016] pneumococcal 23 valent vaccine  0.5 mL Intramuscular Tomorrow-1000  . sodium chloride flush  10-40 mL Intracatheter Q12H  . sodium chloride flush  3 mL Intravenous Q12H  . thiamine  100 mg Oral Daily   Continuous Infusions: . sodium chloride Stopped (06/19/16 1800)  . sodium chloride Stopped (06/17/16 0800)  . sodium chloride Stopped (06/19/16 1800)  . milrinone 0.3 mcg/kg/min (06/20/16 0400)   PRN Meds: sodium chloride, metoprolol, midazolam, ondansetron (ZOFRAN) IV, oxyCODONE, sodium chloride flush, sodium chloride flush, traMADol   Vital Signs    Vitals:   06/20/16 0400 06/20/16 0500 06/20/16 0600 06/20/16 0817  BP:  117/69 105/61 117/75   Pulse: 86 86 87   Resp: 18 19 20    Temp: 97.3 F (36.3 C)   97.7 F (36.5 C)  TempSrc: Oral   Oral  SpO2: 97% 98% 97%   Weight:  198 lb 6.6 oz (90 kg)    Height:        Intake/Output Summary (Last 24 hours) at 06/20/16 0908 Last data filed at 06/20/16 0600  Gross per 24 hour  Intake           1362.6 ml  Output             3015 ml  Net          -1652.4 ml   Filed Weights   06/18/16 0550 06/19/16 0500 06/20/16 0500  Weight: 199 lb 11.8 oz (90.6 kg) 198 lb 10.2 oz (90.1 kg) 198 lb 6.6 oz (90 kg)    Physical Exam    GEN: Well nourished, well developed, Intubated, sedated HEENT: Grossly normal.  Neck: Supple, no JVD, carotid bruits, or masses. Cardiac: RRR, no murmurs, rubs, or gallops. No clubbing, cyanosis, edema.  Respiratory:  Respirations regular and unlabored, clear to auscultation bilaterally. GI: Soft, nontender, nondistended, BS + x 4. MS: no deformity or  atrophy. Skin: warm and dry, no rash. Neuro:  Strength and sensation are intact. Psych: Sedated Ext: Right BKA  Labs    CBC  Recent Labs  06/19/16 0400 06/19/16 1625 06/20/16 0423  WBC 9.3  --  10.6*  HGB 10.9* 10.2* 10.3*  HCT 33.9* 30.0* 31.8*  MCV 90.4  --  89.1  PLT 169  --  211   Basic Metabolic Panel  Recent Labs  06/17/16 1645  06/19/16 0400 06/19/16 1625 06/20/16 0423  NA  --   < > 137 136 138  K  --   < > 4.2 4.1 3.8  CL  --   < > 103 97* 99*  CO2  --   < > 27  --  31  GLUCOSE  --   < > 160* 203* 111*  BUN  --   < > 17 23* 21*  CREATININE 0.98  < > 0.72 0.80 0.79  CALCIUM  --   < > 7.8*  --  8.0*  MG 2.3  --   --   --   --   < > = values in this interval not displayed. Liver Function Tests  Recent Labs  06/19/16 0400 06/20/16 0423  AST 65* 40  ALT 38 37  ALKPHOS 86 105  BILITOT 0.3 0.5  PROT 5.5* 5.5*  ALBUMIN 2.5* 2.4*   No results for input(s): CKTOTAL, CKMB, CKMBINDEX, TROPONINI in the last 72 hours. Fasting Lipid Panel No results for  input(s): CHOL, HDL, LDLCALC, TRIG, CHOLHDL, LDLDIRECT in the last 72 hours. Telemetry    Sinus - Personally Reviewed  Radiology     Cardiac Studies   Cardiac cath 06/16/16: Wall Motion              Left Heart   Left Ventricle The left ventricle is dilated. There is severe left ventricular systolic dysfunction. LV end diastolic pressure is moderately elevated. The left ventricular ejection fraction is less than 25% by visual estimate. No regional wall motion abnormalities. There is no evidence of mitral regurgitation.    Coronary Diagrams   Diagnostic Diagram     Implants     No implant documentation for this case.  PACS Images   Show images for Cardiac catheterization   Link to Procedure Log   Procedure Log    Hemo Data   Flowsheet Row Most Recent Value  LV Systolic Pressure 111 mmHg  LV Diastolic Pressure 20 mmHg  LV EDP 27 mmHg  Arterial Occlusion Pressure Extended Systolic Pressure 97 mmHg  Arterial Occlusion Pressure Extended Diastolic Pressure 52 mmHg  Arterial Occlusion Pressure Extended Mean Pressure 70 mmHg  Left Ventricular Apex Extended Systolic Pressure 97 mmHg  Left Ventricular Apex Extended Diastolic Pressure 14 mmHg  Left Ventricular Apex Extended EDP Pressure 25 mmHg     Patient Profile     61 yo male with type 1 DM, HTN, CAD admitted with anterior STEMI and found to have severe stenosis in the distal LM, ostial LAD, ostial Circumflex, OM2, proximal RCA. Emergent 3V CABG 06/16/16.   Assessment & Plan    1. CAD/Anterior STEMI: Pt admitted with anterior STEMI. 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 Now s/p 3V CABG. Stable this am. He is on ASA and beta blocker.   2. Ischemic cardiomyopathy: LVEF less than 20% by LV gram. Hopefully will improve with revascularization. Will repeat echo today. If LVEF is below 35%, will need Lifevest before discharge.   3. DM   4. Community  acquired pneumonia: continue antibiotics.   Signed, Verne Carrow, MD    06/20/2016, 9:08 AM

## 2016-06-20 NOTE — Progress Notes (Signed)
CT Surgery PM Labs  OOB to chair diuresis ing Insulin orders adjusted

## 2016-06-20 NOTE — Progress Notes (Signed)
4 Days Post-Op Procedure(s) (LRB): CORONARY ARTERY BYPASS GRAFTING (CABG) x 3 (N/A) ENDOVEIN HARVEST OF GREATER SAPHENOUS VEIN (Left) Subjective: IABP out OOB to chair BKA prosthesis will not fit due to edema NSR on milrinone co-ox 54% Objective: Vital signs in last 24 hours: Temp:  [97.3 F (36.3 C)-99 F (37.2 C)] 97.7 F (36.5 C) (01/11 0817) Pulse Rate:  [81-133] 87 (01/11 0600) Cardiac Rhythm: Normal sinus rhythm (01/11 0730) Resp:  [14-27] 20 (01/11 0600) BP: (105-153)/(55-107) 117/75 (01/11 0600) SpO2:  [88 %-99 %] 97 % (01/11 0600) Arterial Line BP: (112-167)/(49-71) 136/52 (01/10 1800) Weight:  [198 lb 6.6 oz (90 kg)] 198 lb 6.6 oz (90 kg) (01/11 0500)  Hemodynamic parameters for last 24 hours: PAP: (34-43)/(20-30) 35/20 CO:  [4 L/min-5.1 L/min] 4.2 L/min CI:  [2 L/min/m2-2.5 L/min/m2] 2.1 L/min/m2  Intake/Output from previous day: 01/10 0701 - 01/11 0700 In: 1477.8 [P.O.:300; I.V.:839.8; Blood:238; IV Piggyback:100] Out: 3700 [Urine:2910; Chest Tube:790] Intake/Output this shift: No intake/output data recorded.       Exam    General- alert and comfortable   Lungs- clear without rales, wheezes   Cor- regular rate and rhythm, no murmur , gallop   Abdomen- soft, non-tender   Extremities - warm, non-tender, minimal edema   Neuro- oriented, appropriate, no focal weakness   Lab Results:  Recent Labs  06/19/16 0400 06/19/16 1625 06/20/16 0423  WBC 9.3  --  10.6*  HGB 10.9* 10.2* 10.3*  HCT 33.9* 30.0* 31.8*  PLT 169  --  211   BMET:  Recent Labs  06/19/16 0400 06/19/16 1625 06/20/16 0423  NA 137 136 138  K 4.2 4.1 3.8  CL 103 97* 99*  CO2 27  --  31  GLUCOSE 160* 203* 111*  BUN 17 23* 21*  CREATININE 0.72 0.80 0.79  CALCIUM 7.8*  --  8.0*    PT/INR:  Recent Labs  06/19/16 0918  LABPROT 15.3*  INR 1.20   ABG    Component Value Date/Time   PHART 7.383 06/18/2016 1637   HCO3 26.0 06/18/2016 1637   TCO2 30 06/19/2016 1625   O2SAT  54.5 06/20/2016 0420   CBG (last 3)   Recent Labs  06/19/16 1922 06/20/16 0356 06/20/16 0813  GLUCAP 88 93 94    Assessment/Plan: S/P Procedure(s) (LRB): CORONARY ARTERY BYPASS GRAFTING (CABG) x 3 (N/A) ENDOVEIN HARVEST OF GREATER SAPHENOUS VEIN (Left) Mobilize Diuresis Diabetes control d/c tubes/lines Reduce levimir dose for type 1 DM  LOS: 4 days    Kathlee Nationseter Van Trigt III 06/20/2016

## 2016-06-21 ENCOUNTER — Inpatient Hospital Stay (HOSPITAL_COMMUNITY): Payer: BLUE CROSS/BLUE SHIELD

## 2016-06-21 DIAGNOSIS — R5381 Other malaise: Secondary | ICD-10-CM

## 2016-06-21 DIAGNOSIS — Z951 Presence of aortocoronary bypass graft: Secondary | ICD-10-CM

## 2016-06-21 DIAGNOSIS — Z89511 Acquired absence of right leg below knee: Secondary | ICD-10-CM

## 2016-06-21 DIAGNOSIS — I429 Cardiomyopathy, unspecified: Secondary | ICD-10-CM

## 2016-06-21 LAB — ECHOCARDIOGRAM COMPLETE
HEIGHTINCHES: 70 in
Weight: 3202.84 oz

## 2016-06-21 LAB — COMPREHENSIVE METABOLIC PANEL
ALT: 39 U/L (ref 17–63)
AST: 34 U/L (ref 15–41)
Albumin: 2.4 g/dL — ABNORMAL LOW (ref 3.5–5.0)
Alkaline Phosphatase: 115 U/L (ref 38–126)
Anion gap: 9 (ref 5–15)
BUN: 26 mg/dL — ABNORMAL HIGH (ref 6–20)
CO2: 34 mmol/L — ABNORMAL HIGH (ref 22–32)
Calcium: 8.2 mg/dL — ABNORMAL LOW (ref 8.9–10.3)
Chloride: 97 mmol/L — ABNORMAL LOW (ref 101–111)
Creatinine, Ser: 0.82 mg/dL (ref 0.61–1.24)
GFR calc Af Amer: >60 mL/min (ref >60)
GFR calc non Af Amer: >60 mL/min (ref >60)
Glucose, Bld: 146 mg/dL — ABNORMAL HIGH (ref 65–99)
Potassium: 3.5 mmol/L (ref 3.5–5.1)
Sodium: 140 mmol/L (ref 135–145)
Total Bilirubin: 0.6 mg/dL (ref 0.3–1.2)
Total Protein: 5.8 g/dL — ABNORMAL LOW (ref 6.5–8.1)

## 2016-06-21 LAB — CBC
HCT: 33.3 % — ABNORMAL LOW (ref 39.0–52.0)
Hemoglobin: 10.7 g/dL — ABNORMAL LOW (ref 13.0–17.0)
MCH: 29 pg (ref 26.0–34.0)
MCHC: 32.1 g/dL (ref 30.0–36.0)
MCV: 90.2 fL (ref 78.0–100.0)
Platelets: 285 10*3/uL (ref 150–400)
RBC: 3.69 MIL/uL — ABNORMAL LOW (ref 4.22–5.81)
RDW: 13.4 % (ref 11.5–15.5)
WBC: 12 10*3/uL — ABNORMAL HIGH (ref 4.0–10.5)

## 2016-06-21 LAB — POCT I-STAT, CHEM 8
BUN: 25 mg/dL — ABNORMAL HIGH (ref 6–20)
CALCIUM ION: 1.14 mmol/L — AB (ref 1.15–1.40)
CHLORIDE: 91 mmol/L — AB (ref 101–111)
Creatinine, Ser: 0.9 mg/dL (ref 0.61–1.24)
Glucose, Bld: 228 mg/dL — ABNORMAL HIGH (ref 65–99)
HCT: 33 % — ABNORMAL LOW (ref 39.0–52.0)
Hemoglobin: 11.2 g/dL — ABNORMAL LOW (ref 13.0–17.0)
Potassium: 3.9 mmol/L (ref 3.5–5.1)
SODIUM: 138 mmol/L (ref 135–145)
TCO2: 36 mmol/L (ref 0–100)

## 2016-06-21 LAB — COOXEMETRY PANEL
Carboxyhemoglobin: 1.1 % (ref 0.5–1.5)
Methemoglobin: 1 % (ref 0.0–1.5)
O2 Saturation: 57.7 %
Total hemoglobin: 10.8 g/dL — ABNORMAL LOW (ref 12.0–16.0)

## 2016-06-21 LAB — GLUCOSE, CAPILLARY
GLUCOSE-CAPILLARY: 187 mg/dL — AB (ref 65–99)
GLUCOSE-CAPILLARY: 154 mg/dL — AB (ref 65–99)
Glucose-Capillary: 111 mg/dL — ABNORMAL HIGH (ref 65–99)
Glucose-Capillary: 169 mg/dL — ABNORMAL HIGH (ref 65–99)

## 2016-06-21 MED ORDER — DEXTROSE 5 % IV SOLN
1.0000 g | Freq: Three times a day (TID) | INTRAVENOUS | Status: DC
  Administered 2016-06-22 – 2016-06-27 (×17): 1 g via INTRAVENOUS
  Filled 2016-06-21 (×20): qty 1

## 2016-06-21 MED ORDER — PERFLUTREN LIPID MICROSPHERE
1.0000 mL | INTRAVENOUS | Status: AC | PRN
  Administered 2016-06-21: 2 mL via INTRAVENOUS
  Filled 2016-06-21: qty 10

## 2016-06-21 MED ORDER — METOLAZONE 5 MG PO TABS
5.0000 mg | ORAL_TABLET | Freq: Every day | ORAL | Status: AC
  Administered 2016-06-21 – 2016-06-23 (×3): 5 mg via ORAL
  Filled 2016-06-21 (×3): qty 1

## 2016-06-21 MED ORDER — SODIUM CHLORIDE 0.9 % IV SOLN
30.0000 meq | Freq: Once | INTRAVENOUS | Status: AC
  Administered 2016-06-21: 30 meq via INTRAVENOUS
  Filled 2016-06-21: qty 15

## 2016-06-21 MED ORDER — SORBITOL 70 % SOLN
25.0000 mL | Freq: Once | Status: AC
  Administered 2016-06-21: 25 mL via ORAL
  Filled 2016-06-21: qty 30

## 2016-06-21 MED ORDER — INSULIN DETEMIR 100 UNIT/ML ~~LOC~~ SOLN
22.0000 [IU] | Freq: Two times a day (BID) | SUBCUTANEOUS | Status: DC
  Administered 2016-06-21 – 2016-06-24 (×7): 22 [IU] via SUBCUTANEOUS
  Filled 2016-06-21 (×9): qty 0.22

## 2016-06-21 NOTE — Clinical Social Work Note (Signed)
Clinical Social Work Assessment  Patient Details  Name: Bryce Ramirez MRN: [id number] Date of Birth: 05/15/1956  Date of referral:  06/21/16               Reason for consult:  Discharge Planning                Permission sought to share information with:  Family Supports Permission granted to share information::  Yes, Verbal Permission Granted  Name::     Delia Heady  Agency::     Relationship::  sister  Contact Information:  [phone]  Housing/Transportation Living arrangements for the past 2 months:  Single Family Home Source of Information:  Patient Patient Interpreter Needed:  None Criminal Activity/Legal Involvement Pertinent to Current Situation/Hospitalization:  No - Comment as needed Significant Relationships:  Adult Children, Pets, Siblings Lives with:  Self Do you feel safe going back to the place where you live?  Yes Need for family participation in patient care:  No (Coment)  Care giving concerns: Family support are all in [place]  Social Worker assessment / plan:  Holiday representative met patient at bedside. Patient stated he lives in [place] and lives by himself with his animals. Patient stated he would prefer to be in a SNF in [place] to be closer to family. Patient stated he would like to go to [place]. CSW will contact facility and see if any beds are available.    Employment status:  Unemployed Forensic scientist:  Other (Comment Required) Actor) PT Recommendations:  [place] / Referral to community resources:  [place]  Patient/Family's Response to care:  Patient verbalized appreciation and understanding for CSW role and involvement in care. Patient agreeable with current discharge plan to SNF.   Patient/Family's Understanding of and Emotional Response to Diagnosis, Current Treatment, and Prognosis:  Patient with good understanding of current medical state and limitations around most  recent hospitalization. Patent is agreeable with SNF placement in hopes of returning back home to his pets and job    Emotional Assessment Appearance:  Appears stated age Attitude/Demeanor/Rapport:  Other Affect (typically observed):  Calm, Pleasant Orientation:  Oriented to Self, Oriented to Place, Oriented to  Time, Oriented to Situation Alcohol / Substance use:    Psych involvement (Current and /or in the community):  No (Comment)  Discharge Needs  Concerns to be addressed:  No discharge needs identified Readmission within the last 30 days:  No Current discharge risk:  None Barriers to Discharge:  No Barriers Identified   Wende Neighbors, LCSW 06/21/2016, 4:18 PM

## 2016-06-21 NOTE — Consult Note (Signed)
Physical Medicine and Rehabilitation Consult Reason for Consult: Deconditioning after MI with CABG and history of BKA Referring Physician: Dr. Nils Pyle   HPI: Bryce Ramirez is a 61 y.o. 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 Per chart review patient lives in [place] alone. He used a prosthesis and cane prior to admission. He was attending a meeting in the [place] area. One level home. He reports he has family in the [place] area to assist him. Presented 06/16/2016 with chest pain and shortness of breath. In the emergency department he was found to have ST elevation with MI positive enzymes and EKG changes. Chest x-ray showed pulmonary edema. Emergent cardiac catheterization found to have 95% distal left main stenosis, proximal 95% stenosis of the RCA and 99% ostial stenosis. Previously placed stents with high-grade stenosis. Ejection fraction 20%. Cardiology as well as cardiothoracic surgery consulted.Underwent emergent CABG 3 06/16/2016 per Dr. Lucianne Lei trigt. Hospital course pain management with sternal precautions. Community-acquired pneumonia maintained on antibiotic therapy. Acute blood loss anemia 10.3 and monitored. Subcutaneous Lovenox for DVT prophylaxis. Occupational therapy evaluation completed 06/20/2016. M.D. has requested physical medicine rehabilitation consult.    Review of Systems  Constitutional: Negative for chills and fever.  HENT: Negative for hearing loss and tinnitus.   Eyes: Negative for blurred vision and double vision.  Respiratory: Positive for shortness of breath. Negative for cough.   Cardiovascular: Positive for chest pain and palpitations.  Gastrointestinal: Positive for constipation. Negative for nausea and vomiting.  Genitourinary: Negative for dysuria,  flank pain and hematuria.  Musculoskeletal: Positive for joint pain and myalgias.  Skin: Negative for rash.  Neurological: Positive for weakness. Negative for seizures.  All other systems reviewed and are negative.  Past Medical History:  Diagnosis Date  . CAD (coronary artery disease)    3 stents placed in ? vessels in [place], [place] in 2006  . Diabetes mellitus without complication ([place])   . Hypertension    Past Surgical History:  Procedure Laterality Date  . BELOW KNEE LEG AMPUTATION Right   . CARDIAC CATHETERIZATION N/A 06/16/2016   Procedure: Left Heart Cath and Coronary Angiography;  Surgeon: Burnell Blanks, MD;  Location: [facility];  Service: Cardiovascular;  Laterality: N/A;  . CARDIAC CATHETERIZATION N/A 06/16/2016   Procedure: IABP Insertion;  Surgeon: Burnell Blanks, MD;  Location: [facility];  Service: Cardiovascular;  Laterality: N/A;  . CARPAL TUNNEL RELEASE    . CORONARY ARTERY BYPASS GRAFT N/A 06/16/2016   Procedure: CORONARY ARTERY BYPASS GRAFTING (CABG) x 3;  Surgeon: Ivin Poot, MD;  Location: [facility];  Service: Open Heart Surgery;  Laterality: N/A;  . ENDOVEIN HARVEST OF GREATER SAPHENOUS VEIN Left 06/16/2016   Procedure: ENDOVEIN HARVEST OF GREATER SAPHENOUS VEIN;  Surgeon: Ivin Poot, MD;  Location: [facility];  Service: Open Heart Surgery;  Laterality: Left;  . TOE AMPUTATION Left    Family History  Problem Relation Age of Onset  . CAD Father    Social History:  reports that he has quit smoking. He has quit using smokeless tobacco. He reports that he drinks alcohol. He reports that he does not use drugs. Allergies: No Known Allergies Medications Prior to Admission  Medication Sig Dispense Refill  . amLODipine (NORVASC) 5 MG tablet Take 5 mg by mouth daily.    Marland Kitchen  aspirin EC 81 MG tablet Take 81 mg by mouth daily.    Marland Kitchen atorvastatin (LIPITOR) 80 MG tablet Take 80 mg by mouth at bedtime.    Marland Kitchen buPROPion (WELLBUTRIN SR) 100 MG 12 hr  tablet Take 100 mg by mouth daily.    . carvedilol (COREG) 25 MG tablet Take 25 mg by mouth 2 (two) times daily. 9am, 9pm    . clopidogrel (PLAVIX) 75 MG tablet Take 75 mg by mouth daily.    . diazepam (VALIUM) 5 MG/ML solution Take 5 mg by mouth once as needed (seizures).    . DULoxetine (CYMBALTA) 30 MG capsule Take 90 mg by mouth daily.    . fluticasone (FLONASE) 50 MCG/ACT nasal spray Place 1 spray into both nostrils See admin instructions. Ordered 06/12/16: instill 1 spray into each nostril daily as needed for congestion    . gabapentin (NEURONTIN) 300 MG capsule Take 300 mg by mouth 2 (two) times daily.    Marland Kitchen guaiFENesin (MUCINEX) 600 MG 12 hr tablet Take 600 mg by mouth 2 (two) times daily as needed (congestion). 9am, 5pm    . hydrochlorothiazide (HYDRODIURIL) 25 MG tablet Take 25 mg by mouth daily.    Marland Kitchen ibuprofen (ADVIL,MOTRIN) 600 MG tablet Take 600 mg by mouth 4 (four) times daily as needed for fever (pain).    . insulin glargine (LANTUS) 100 unit/mL SOPN Inject 10 Units into the skin at bedtime.    . Insulin Human (INSULIN PUMP) SOLN Inject into the skin. Novolog    . Multiple Vitamin-Folic Acid TABS Take 1 tablet by mouth daily.    . Omega-3 Fatty Acids (FISH OIL) 1000 MG CAPS Take 2,000 mg by mouth daily.    . phenylephrine (SUDAFED PE) 10 MG TABS tablet Take 10 mg by mouth See admin instructions. Ordered 06/12/16: take 1 tablet (10 mg) by mouth three times daily as needed for congestion    . Potassium 99 MG TABS Take 198 mg by mouth daily.    Marland Kitchen thiamine 100 MG tablet Take 100 mg by mouth daily.    . traZODone (DESYREL) 50 MG tablet Take 50 mg by mouth at bedtime.      Home: Home Living Family/patient expects to be discharged to:: Skilled nursing facility Living Arrangements: Alone Available Help at Discharge: Friend(s), Available PRN/intermittently Type of Home: House Home Access: Stairs to enter [employer] of Steps: 1 Home Layout: One level Bathroom Shower/Tub:  Chiropodist: Standard Home Equipment: Cane - single point, Wheelchair - manual, Tub bench  Functional History: Prior Function Level of Independence: Independent with assistive device(s) Comments: pt normally walks with a cane and prosthesis Functional Status:  Mobility: Bed Mobility Overal bed mobility: Needs Assistance Bed Mobility: Supine to Sit Supine to sit: Min assist General bed mobility comments: cues for sequence and precautions Transfers Overall transfer level: Needs assistance Transfers: Sit to/from Stand Sit to Stand: Mod assist, +2 physical assistance, +2 safety/equipment General transfer comment: cues for hand placement and sequence, prosthesis in place but unable to click in and pt still able to place weight through bil LE with hands on thighs. assist for anterior translation and rise Ambulation/Gait Ambulation/Gait assistance: Min assist, +2 safety/equipment Ambulation Distance (Feet): 5 Feet Assistive device: Rolling walker (2 wheeled) Gait Pattern/deviations: Step-to pattern General Gait Details: pt side stepping bed to chair with RW with 2 person assist and cues, limited by prosthesis not fitting, cues for sequence and safety Gait velocity interpretation: Below normal speed for age/gender  ADL:    Cognition: Cognition Overall Cognitive Status: Impaired/Different from baseline Orientation Level: Oriented X4 Cognition Arousal/Alertness: Awake/alert Behavior During Therapy: WFL for tasks assessed/performed Overall Cognitive Status: Impaired/Different from baseline Area of Impairment: Attention, Memory, Following commands, Safety/judgement, Problem solving Current Attention Level: Sustained Memory: Decreased short-term memory, Decreased recall of precautions Following Commands: Follows one step commands inconsistently Safety/Judgement: Decreased awareness of safety, Decreased awareness of deficits Problem Solving: Slow processing,  Decreased initiation, Requires verbal cues General Comments: pt with constant cues not to push, difficulty maintaining eyes open in bed, pt unable to process how to don prosthesis or fact that leg too edematous to fit  Blood pressure 122/73, pulse 81, temperature 98 F (36.7 C), temperature source Oral, resp. rate 14, height '5\' 10"'$  (1.778 m), weight 90.8 kg (200 lb 2.8 oz), SpO2 98 %. Physical Exam  Constitutional: He is oriented to person, place, and time. He appears well-developed.  HENT:  Head: Normocephalic.  Eyes: EOM are normal.  Neck: Normal range of motion. Neck supple. No thyromegaly present.  Cardiovascular: Normal rate and regular rhythm.   Respiratory: Effort normal and breath sounds normal. No respiratory distress. He has no wheezes.  Chest tube in place  GI: Soft. Bowel sounds are normal. He exhibits no distension.  Musculoskeletal:  Right bka with shrinker. Well shaped, minimal edema  Neurological: He is alert and oriented to person, place, and time.  Follows commands. UE 4/5 with some pain inhibition particularly proximal. LE: 3/5 HF, 3+ KE and 4/5 ADF/PF. No gross sensory deficits.   Skin:  Chest incision clean and dry    Results for orders placed or performed during the hospital encounter of 06/16/16 (from the past 24 hour(s))  Glucose, capillary     Status: None   Collection Time: 06/20/16  8:13 AM  Result Value Ref Range   Glucose-Capillary 94 65 - 99 mg/dL   Comment 1 Capillary Specimen    Comment 2 Notify RN   Glucose, capillary     Status: Abnormal   Collection Time: 06/20/16 12:27 PM  Result Value Ref Range   Glucose-Capillary 162 (H) 65 - 99 mg/dL  Glucose, capillary     Status: Abnormal   Collection Time: 06/20/16  4:46 PM  Result Value Ref Range   Glucose-Capillary 217 (H) 65 - 99 mg/dL   Comment 1 Capillary Specimen    Comment 2 Notify RN   I-STAT, chem 8     Status: Abnormal   Collection Time: 06/20/16  5:19 PM  Result Value Ref Range   Sodium  138 135 - 145 mmol/L   Potassium 4.0 3.5 - 5.1 mmol/L   Chloride 92 (L) 101 - 111 mmol/L   BUN 31 (H) 6 - 20 mg/dL   Creatinine, Ser 1.00 0.61 - 1.24 mg/dL   Glucose, Bld 287 (H) 65 - 99 mg/dL   Calcium, Ion 1.13 (L) 1.15 - 1.40 mmol/L   TCO2 32 0 - 100 mmol/L   Hemoglobin 11.2 (L) 13.0 - 17.0 g/dL   HCT 33.0 (L) 39.0 - 52.0 %  Glucose, capillary     Status: Abnormal   Collection Time: 06/20/16  9:37 PM  Result Value Ref Range   Glucose-Capillary 189 (H) 65 - 99 mg/dL   Comment 1 Capillary Specimen    Comment 2 Notify RN    Comment 3 Document in Chart   Cooxemetry Panel (carboxy, met, total hgb, O2 sat)     Status: Abnormal   Collection Time: 06/21/16  3:50  AM  Result Value Ref Range   Total hemoglobin 10.8 (L) 12.0 - 16.0 g/dL   O2 Saturation 57.7 %   Carboxyhemoglobin 1.1 0.5 - 1.5 %   Methemoglobin 1.0 0.0 - 1.5 %  CBC     Status: Abnormal   Collection Time: 06/21/16  4:57 AM  Result Value Ref Range   WBC 12.0 (H) 4.0 - 10.5 K/uL   RBC 3.69 (L) 4.22 - 5.81 MIL/uL   Hemoglobin 10.7 (L) 13.0 - 17.0 g/dL   HCT 33.3 (L) 39.0 - 52.0 %   MCV 90.2 78.0 - 100.0 fL   MCH 29.0 26.0 - 34.0 pg   MCHC 32.1 30.0 - 36.0 g/dL   RDW 13.4 11.5 - 15.5 %   Platelets 285 150 - 400 K/uL  Comprehensive metabolic panel     Status: Abnormal   Collection Time: 06/21/16  4:57 AM  Result Value Ref Range   Sodium 140 135 - 145 mmol/L   Potassium 3.5 3.5 - 5.1 mmol/L   Chloride 97 (L) 101 - 111 mmol/L   CO2 34 (H) 22 - 32 mmol/L   Glucose, Bld 146 (H) 65 - 99 mg/dL   BUN 26 (H) 6 - 20 mg/dL   Creatinine, Ser 0.82 0.61 - 1.24 mg/dL   Calcium 8.2 (L) 8.9 - 10.3 mg/dL   Total Protein 5.8 (L) 6.5 - 8.1 g/dL   Albumin 2.4 (L) 3.5 - 5.0 g/dL   AST 34 15 - 41 U/L   ALT 39 17 - 63 U/L   Alkaline Phosphatase 115 38 - 126 U/L   Total Bilirubin 0.6 0.3 - 1.2 mg/dL   GFR calc non Af Amer >60 >60 mL/min   GFR calc Af Amer >60 >60 mL/min   Anion gap 9 5 - 15   Dg Chest Port 1 View  Result Date:  06/20/2016 CLINICAL DATA:  Status post coronary bypass grafting EXAM: PORTABLE CHEST 1 VIEW COMPARISON:  06/19/2016 FINDINGS: Cardiac shadow is again enlarged. Postsurgical changes are again seen. Swan-Ganz catheter has been removed in the interval with interval placement of a right-sided PICC line in satisfactory position. Bilateral thoracostomy catheters and mediastinal drain are again seen and stable. No pneumothorax is noted. Increasing right perihilar infiltrate is noted likely related to increasing right pleural effusion and possibly a mild atelectatic changes. Atelectatic changes are stable on the left. No bony abnormality is seen. IMPRESSION: Tubes and lines as described above. Increasing right-sided pleural effusion. Bilateral atelectatic changes. Electronically Signed   By: Inez Catalina M.D.   On: 06/20/2016 07:09    Assessment/Plan: Diagnosis: debility/functional deficits after CABG (hx of right BKA). 1. Does the need for close, 24 hr/day medical supervision in concert with the patient's rehab needs make it unreasonable for this patient to be served in a less intensive setting? Yes 2. Co-Morbidities requiring supervision/potential complications: CAD, pain control, 3. Due to bladder management, bowel management, safety, skin/wound care, disease management, medication administration, pain management and patient education, does the patient require 24 hr/day rehab nursing? Yes 4. Does the patient require coordinated care of a physician, rehab nurse, PT (1-2 hrs/day, 5 days/week) and OT (1-2 hrs/day, 5 days/week) to address physical and functional deficits in the context of the above medical diagnosis(es)? Yes Addressing deficits in the following areas: balance, endurance, locomotion, strength, transferring, bowel/bladder control, bathing, dressing, feeding, grooming, toileting and psychosocial support 5. Can the patient actively participate in an intensive therapy program of at least 3 hrs of  therapy  per day at least 5 days per week? Yes 6. The potential for patient to make measurable gains while on inpatient rehab is excellent 7. Anticipated functional outcomes upon discharge from inpatient rehab are modified independent  with PT, modified independent with OT, n/a with SLP. 8. Estimated rehab length of stay to reach the above functional goals is: 9-14 days 9. Does the patient have adequate social supports and living environment to accommodate these discharge functional goals? Yes 10. Anticipated D/C setting: Home 11. Anticipated post D/C treatments: HH therapy and Outpatient therapy 12. Overall Rehab/Functional Prognosis: excellent  RECOMMENDATIONS: This patient's condition is appropriate for continued rehabilitative care in the following setting: CIR Patient has agreed to participate in recommended program. Yes Note that insurance prior authorization may be required for reimbursement for recommended care.  Comment: Pt lives in [place]. Has a handicapped accessible home. Independent PTA. Friends can provide some help.   Rehab Admissions Coordinator to follow up.  Thanks,  Meredith Staggers, MD, Mell[facility] Drown    Cathlyn Parsons., PA-C 06/21/2016

## 2016-06-21 NOTE — Clinical Social Work Placement (Signed)
   CLINICAL SOCIAL WORK PLACEMENT  NOTE  Date:  06/21/2016  Patient Details  Name: Bryce Ramirez MRN: [id number] Date of Birth: 08/26/1955  Clinical Social Work is seeking post-discharge placement for this patient at the Skilled  Nursing Facility level of care (*CSW will initial, date and re-position this form in  chart as items are completed):  Yes   Patient/family provided with [facility] Clinical Social Work Department's list of facilities offering this level of care within the geographic area requested by the patient (or if unable, by the patient's family).  Yes   Patient/family informed of their freedom to choose among providers that offer the needed level of care, that participate in Medicare, Medicaid or managed care program needed by the patient, have an available bed and are willing to accept the patient.  Yes   Patient/family informed of [facility] ownership interest in [facility] and [facility], as well as of the fact that they are under no obligation to receive care at these facilities.  PASRR submitted to EDS on       PASRR number received on       Existing PASRR number confirmed on       FL2 transmitted to all facilities in geographic area requested by pt/family on       FL2 transmitted to all facilities within larger geographic area on       Patient informed that his/her managed care company has contracts with or will negotiate with certain facilities, including the following:            Patient/family informed of bed offers received.  Patient chooses bed at       Physician recommends and patient chooses bed at      Patient to be transferred to   on  .  Patient to be transferred to facility by       Patient family notified on   of transfer.  Name of family member notified:        PHYSICIAN Please sign FL2     Additional Comment:    _______________________________________________ Althea CharonAshley C Trayce Caravello, LCSW 06/21/2016, 4:25 PM

## 2016-06-21 NOTE — Progress Notes (Signed)
Patient Name: Bryce Ramirez Date of Encounter: 06/21/2016  [facility]  Hospital Problem List     Active Problems:   ST elevation myocardial infarction (STEMI) (HCC)   S/P CABG x 3    Subjective   No complaints.   Inpatient Medications    Scheduled Meds: . acetaminophen  1,000 mg Oral Q6H   Or  . acetaminophen (TYLENOL) oral liquid 160 mg/5 mL  1,000 mg Per Tube Q6H  . aspirin EC  325 mg Oral Daily   Or  . aspirin  324 mg Per Tube Daily  . bisacodyl  10 mg Oral Daily   Or  . bisacodyl  10 mg Rectal Daily  . buPROPion  100 mg Oral Daily  . docusate sodium  200 mg Oral Daily  . DULoxetine  90 mg Oral Daily  . enoxaparin (LOVENOX) injection  40 mg Subcutaneous Q24H  . fentaNYL  50 mcg Transdermal Q72H  . furosemide  40 mg Intravenous BID  . gabapentin  300 mg Oral BID  . guaiFENesin  600 mg Oral BID  . insulin aspart  0-5 Units Subcutaneous QHS  . insulin aspart  0-9 Units Subcutaneous TID WC  . insulin aspart  3 Units Subcutaneous TID WC  . insulin detemir  18 Units Subcutaneous BID  . lisinopril  5 mg Oral Daily  . mouth rinse  15 mL Mouth Rinse q12n4p  . metoCLOPramide (REGLAN) injection  10 mg Intravenous Q6H  . metolazone  5 mg Oral Daily  . metoprolol tartrate  12.5 mg Oral BID   Or  . metoprolol tartrate  12.5 mg Per Tube BID  . pantoprazole  40 mg Oral Q1200  . pneumococcal 23 valent vaccine  0.5 mL Intramuscular Tomorrow-1000  . potassium chloride (KCL MULTIRUN) 30 mEq in 265 mL IVPB  30 mEq Intravenous Once  . sodium chloride flush  10-40 mL Intracatheter Q12H  . sodium chloride flush  3 mL Intravenous Q12H  . sorbitol  25 mL Oral Once  . thiamine  100 mg Oral Daily   Continuous Infusions: . sodium chloride Stopped (06/19/16 1800)  . sodium chloride Stopped (06/17/16 0800)  . sodium chloride Stopped (06/19/16 1800)  . milrinone 0.25 mcg/kg/min (06/21/16 0819)   PRN Meds: sodium chloride, metoprolol, midazolam, ondansetron  (ZOFRAN) IV, oxyCODONE, sodium chloride flush, sodium chloride flush, traMADol   Vital Signs    Vitals:   06/21/16 0500 06/21/16 0600 06/21/16 0700 06/21/16 0757  BP: 119/66 122/73 (!) 148/69   Pulse: 81 81 85   Resp: 12 14 20    Temp:    97.8 F (36.6 C)  TempSrc:    Oral  SpO2: 99% 98% 100%   Weight:      Height:        Intake/Output Summary (Last 24 hours) at 06/21/16 0856 Last data filed at 06/21/16 0700  Gross per 24 hour  Intake              455 ml  Output             2180 ml  Net            -1725 ml   Filed Weights   06/19/16 0500 06/20/16 0500 06/21/16 0400  Weight: 198 lb 10.2 oz (90.1 kg) 198 lb 6.6 oz (90 kg) 200 lb 2.8 oz (90.8 kg)    Physical Exam    GEN: Well nourished, well developed, awake and sitting in chair. Oriented x 3.  HEENT:  Grossly normal.  Neck: Supple, no JVD, carotid bruits, or masses. Cardiac: RRR, no murmurs, rubs, or gallops. No clubbing, cyanosis, edema.  Respiratory:  Respirations regular and unlabored, clear to auscultation bilaterally. GI: Soft, nontender, nondistended, BS + x 4. MS: no deformity or atrophy. Skin: warm and dry, no rash. Neuro:  Strength and sensation are intact. Psych: Sedated Ext: Right BKA  Labs    CBC  Recent Labs  06/20/16 0423 06/20/16 1719 06/21/16 0457  WBC 10.6*  --  12.0*  HGB 10.3* 11.2* 10.7*  HCT 31.8* 33.0* 33.3*  MCV 89.1  --  90.2  PLT 211  --  285   Basic Metabolic Panel  Recent Labs  06/20/16 0423 06/20/16 1719 06/21/16 0457  NA 138 138 140  K 3.8 4.0 3.5  CL 99* 92* 97*  CO2 31  --  34*  GLUCOSE 111* 287* 146*  BUN 21* 31* 26*  CREATININE 0.79 1.00 0.82  CALCIUM 8.0*  --  8.2*   Liver Function Tests  Recent Labs  06/20/16 0423 06/21/16 0457  AST 40 34  ALT 37 39  ALKPHOS 105 115  BILITOT 0.5 0.6  PROT 5.5* 5.8*  ALBUMIN 2.4* 2.4*   No results for input(s): CKTOTAL, CKMB, CKMBINDEX, TROPONINI in the last 72 hours. Fasting Lipid Panel No results for input(s):  CHOL, HDL, LDLCALC, TRIG, CHOLHDL, LDLDIRECT in the last 72 hours. Telemetry    Sinus - Personally Reviewed  Radiology     Cardiac Studies   Cardiac cath 06/16/16: Wall Motion              Left Heart   Left Ventricle The left ventricle is dilated. There is severe left ventricular systolic dysfunction. LV end diastolic pressure is moderately elevated. The left ventricular ejection fraction is less than 25% by visual estimate. No regional wall motion abnormalities. There is no evidence of mitral regurgitation.    Coronary Diagrams   Diagnostic Diagram     Implants     No implant documentation for this case.  PACS Images   Show images for Cardiac catheterization   Link to Procedure Log   Procedure Log    Hemo Data   Flowsheet Row Most Recent Value  LV Systolic Pressure 111 mmHg  LV Diastolic Pressure 20 mmHg  LV EDP 27 mmHg  Arterial Occlusion Pressure Extended Systolic Pressure 97 mmHg  Arterial Occlusion Pressure Extended Diastolic Pressure 52 mmHg  Arterial Occlusion Pressure Extended Mean Pressure 70 mmHg  Left Ventricular Apex Extended Systolic Pressure 97 mmHg  Left Ventricular Apex Extended Diastolic Pressure 14 mmHg  Left Ventricular Apex Extended EDP Pressure 25 mmHg     Patient Profile     61 yo male with type 1 DM, HTN, CAD admitted with anterior STEMI and found to have severe stenosis in the distal LM, ostial LAD, ostial Circumflex, OM2, proximal RCA. Emergent 3V CABG 06/16/16.   Assessment & Plan    1. CAD/Anterior STEMI: Pt admitted with anterior STEMI. 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 Now s/p 3V CABG. Stable this am. He is on ASA and beta blocker. Would start a statin.   2. Ischemic cardiomyopathy: LVEF less than 20% by LV gram. Hopefully will improve with revascularization. Repeat echo is pending. If LVEF is below 35%, will need Lifevest before discharge.   Signed, Verne Carrow, MD  06/21/2016, 8:56 AM

## 2016-06-21 NOTE — Care Management Note (Addendum)
Case Management Note  Patient Details  Name: Bryce Ramirez MRN: [id number] Date of Birth: 06/10/1955  Subjective/Objective:    Pt lives alone, and per his sister, there will be no one available to provide 24/7 assistance after he completes rehab.  Discussed options with pt and sister and sister feels that [place] be optimal plan if pt needs much assistance post-rehab stay, pt is agreeable to either option.  Inpatient Rehab Admissions Coordinator will also discuss options with pt and sister.              Expected Discharge Plan: Skilled Nursing Facility vs Inpatient Rehab  In-House Referral: Clinical Social Work    Discharge planning Services  CM Consult  Status of Service:  In process, will continue to follow  Magdalene RiverMayo, Emmelina Mcloughlin T, RN 06/21/2016, 12:49 PM

## 2016-06-21 NOTE — Progress Notes (Signed)
Patient ID: Bryce Ramirez, male   DOB: 01/13/1956, 61 y.o.   MRN: [id number]  SICU Evening Rounds:  He has been hemodynamically stable on Milrinone 0.25  Diuresing well  BMET    Component Value Date/Time   NA 138 06/21/2016 1556   K 3.9 06/21/2016 1556   CL 91 (L) 06/21/2016 1556   CO2 34 (H) 06/21/2016 0457   GLUCOSE 228 (H) 06/21/2016 1556   BUN 25 (H) 06/21/2016 1556   CREATININE 0.90 06/21/2016 1556   CALCIUM 8.2 (L) 06/21/2016 0457   GFRNONAA >60 06/21/2016 0457   GFRAA >60 06/21/2016 0457   Continue current plans

## 2016-06-21 NOTE — Progress Notes (Signed)
Inpatient Rehabilitation  Met with patient to discuss IP Rehab MD's recommendations.  Shared booklets and answered questions about our program.  Note that physical therapy is recommending SNF at this time.  Patient reported that he and his sister would meet this weekend to discuss rehab options here as well as in [place].  Plan for my co-worker Gerlean Ren to follow on Monday.    Carmelia Roller., CCC/SLP Admission Coordinator  [facility]  Cell [phone]

## 2016-06-21 NOTE — Progress Notes (Signed)
Inpatient Diabetes Program Recommendations  AACE/ADA: New Consensus Statement on Inpatient Glycemic Control (2015)  Target Ranges:  Prepandial:   less than 140 mg/dL      Peak postprandial:   less than 180 mg/dL (1-2 hours)      Critically ill patients:  140 - 180 mg/dL   Results for Bryce Ramirez, [name] (MRN [id number]) as of 06/21/2016 12:46  Ref. Range 06/21/2016 07:55 06/21/2016 11:54  Glucose-Capillary Latest Ref Range: 65 - 99 mg/dL 409111 (H) 811169 (H)    Home DM Meds: Insulin Pump  Current Insulin Orders: Levemir 18 units BID      Novolog Sensitive Correction Scale/ SSI (0-9 units) TID AC + HS      Novolog 3 units TID with meals      Spoke with patient today and reviewed his CBGs and the insulin regimen we are using for him in hospital.  Patient stated he was pleased with his CBGs so far and did not mind being off his insulin pump for now.  Reminded patient that when the decision is made to have pt resume his insulin pump, he will need to suspend his basal rates until 24 hours after last dose of Levemir given.  Explained to pt that the Levemir insulin is a substitute for the basal insulin rates he gets on his pump and that it takes about 24 hours for the Levemir to leave his system.  Suspension of the basal rates will be necessary for 24 hours after Levemir given to prevent hypoglycemia from double basal insulin on board. Patient stated he understood my instructions and knows how to suspend the rates on his pump and will be able to do this whenever he resumes his insulin pump.  Patient can still use the bolus feature on his pump while he waiting to resume his basal rates when pump is eventually resumed.     --Will follow patient during hospitalization--  Ambrose FinlandJeannine Johnston Makala Fetterolf RN, MSN, CDE Diabetes Coordinator Inpatient Glycemic Control Team Team Pager: [phone] (8a-5p)

## 2016-06-21 NOTE — Progress Notes (Signed)
  Echocardiogram 2D Echocardiogram with Definity has been performed.  Cathie BeamsGREGORY, Sevilla Murtagh 06/21/2016, 10:56 AM

## 2016-06-21 NOTE — Progress Notes (Signed)
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 and start mobilizing Sinus rhythm with some ectopy Right lower lobe infiltrate with productive cough, elevated white count-will check sputum culture and treat with Elita QuickFortaz for possible pneumonia Slow wean of no were no now 0.25 Postop echo shows improvement in EF from 10% to 30-35 percent Blood sugars spiking to  260 so we'll slightly increased basal insulin dose Inpatient rehabilitation evaluation in place Objective: Vital signs in last 24 hours: Temp:  [97.8 F (36.6 C)-98.7 F (37.1 C)] 98.4 F (36.9 C) (01/12 1155) Pulse Rate:  [75-119] 85 (01/12 1500) Cardiac Rhythm: Normal sinus rhythm (01/12 1200) Resp:  [12-23] 15 (01/12 1500) BP: (106-159)/(53-103) 122/58 (01/12 1500) SpO2:  [94 %-100 %] 98 % (01/12 1500) Weight:  [200 lb 2.8 oz (90.8 kg)] 200 lb 2.8 oz (90.8 kg) (01/12 0400)  Hemodynamic parameters for last 24 hours:   Stable Intake/Output from previous day: 01/11 0701 - 01/12 0700 In: 455 [I.V.:190; IV Piggyback:265] Out: 2180 [Urine:1900; Chest Tube:280] Intake/Output this shift: Total I/O In: 26.5 [I.V.:26.5] Out: 1700 [Urine:1700]       Exam    General- alert and comfortable   Lungs- clear without rales, wheezes   Cor- regular rate and rhythm, no murmur , gallop   Abdomen- soft, non-tender   Extremities - warm, non-tender, minimal edema   Neuro- oriented, appropriate, no focal weakness   Lab Results:  Recent Labs  06/20/16 0423  06/21/16 0457 06/21/16 1556  WBC 10.6*  --  12.0*  --   HGB 10.3*  < > 10.7* 11.2*  HCT 31.8*  < > 33.3* 33.0*  PLT 211  --  285  --   < > = values in this interval not displayed. BMET:  Recent Labs  06/20/16 0423  06/21/16 0457 06/21/16 1556  NA 138  < > 140 138   K 3.8  < > 3.5 3.9  CL 99*  < > 97* 91*  CO2 31  --  34*  --   GLUCOSE 111*  < > 146* 228*  BUN 21*  < > 26* 25*  CREATININE 0.79  < > 0.82 0.90  CALCIUM 8.0*  --  8.2*  --   < > = values in this interval not displayed.  PT/INR:  Recent Labs  06/19/16 0918  LABPROT 15.3*  INR 1.20   ABG    Component Value Date/Time   PHART 7.383 06/18/2016 1637   HCO3 26.0 06/18/2016 1637   TCO2 36 06/21/2016 1556   O2SAT 57.7 06/21/2016 0350   CBG (last 3)   Recent Labs  06/20/16 2137 06/21/16 0755 06/21/16 1154  GLUCAP 189* 111* 169*    Assessment/Plan: S/P Procedure(s) (LRB): CORONARY ARTERY BYPASS GRAFTING (CABG) x 3 (N/A) ENDOVEIN HARVEST OF GREATER SAPHENOUS VEIN (Left) Mobilize Diuresis Diabetes control Add Fortaz and check sputum culture Slow milrinone wean based on a.m. co-ox panel  LOS: 5 days    Bryce Ramirez 06/21/2016

## 2016-06-22 ENCOUNTER — Inpatient Hospital Stay (HOSPITAL_COMMUNITY): Payer: BLUE CROSS/BLUE SHIELD

## 2016-06-22 LAB — POCT I-STAT, CHEM 8
BUN: 28 mg/dL — AB (ref 6–20)
CALCIUM ION: 1.16 mmol/L (ref 1.15–1.40)
Chloride: 89 mmol/L — ABNORMAL LOW (ref 101–111)
Creatinine, Ser: 1.1 mg/dL (ref 0.61–1.24)
Glucose, Bld: 320 mg/dL — ABNORMAL HIGH (ref 65–99)
HCT: 37 % — ABNORMAL LOW (ref 39.0–52.0)
HEMOGLOBIN: 12.6 g/dL — AB (ref 13.0–17.0)
Potassium: 4.1 mmol/L (ref 3.5–5.1)
SODIUM: 134 mmol/L — AB (ref 135–145)
TCO2: 39 mmol/L (ref 0–100)

## 2016-06-22 LAB — COMPREHENSIVE METABOLIC PANEL
ALT: 39 U/L (ref 17–63)
AST: 33 U/L (ref 15–41)
Albumin: 2.3 g/dL — ABNORMAL LOW (ref 3.5–5.0)
Alkaline Phosphatase: 114 U/L (ref 38–126)
Anion gap: 12 (ref 5–15)
BUN: 24 mg/dL — ABNORMAL HIGH (ref 6–20)
CO2: 36 mmol/L — ABNORMAL HIGH (ref 22–32)
Calcium: 8.2 mg/dL — ABNORMAL LOW (ref 8.9–10.3)
Chloride: 90 mmol/L — ABNORMAL LOW (ref 101–111)
Creatinine, Ser: 0.94 mg/dL (ref 0.61–1.24)
GFR calc Af Amer: >60 mL/min (ref >60)
GFR calc non Af Amer: >60 mL/min (ref >60)
Glucose, Bld: 138 mg/dL — ABNORMAL HIGH (ref 65–99)
Potassium: 3.3 mmol/L — ABNORMAL LOW (ref 3.5–5.1)
Sodium: 138 mmol/L (ref 135–145)
Total Bilirubin: 0.5 mg/dL (ref 0.3–1.2)
Total Protein: 5.7 g/dL — ABNORMAL LOW (ref 6.5–8.1)

## 2016-06-22 LAB — CBC
HCT: 32.8 % — ABNORMAL LOW (ref 39.0–52.0)
Hemoglobin: 10.8 g/dL — ABNORMAL LOW (ref 13.0–17.0)
MCH: 29.3 pg (ref 26.0–34.0)
MCHC: 32.9 g/dL (ref 30.0–36.0)
MCV: 88.9 fL (ref 78.0–100.0)
Platelets: 315 10*3/uL (ref 150–400)
RBC: 3.69 MIL/uL — ABNORMAL LOW (ref 4.22–5.81)
RDW: 13.1 % (ref 11.5–15.5)
WBC: 13 10*3/uL — ABNORMAL HIGH (ref 4.0–10.5)

## 2016-06-22 LAB — COOXEMETRY PANEL
Carboxyhemoglobin: 0.8 % (ref 0.5–1.5)
Methemoglobin: 1.3 % (ref 0.0–1.5)
O2 Saturation: 58 %
Total hemoglobin: 10.8 g/dL — ABNORMAL LOW (ref 12.0–16.0)

## 2016-06-22 LAB — GLUCOSE, CAPILLARY
GLUCOSE-CAPILLARY: 307 mg/dL — AB (ref 65–99)
Glucose-Capillary: 93 mg/dL (ref 65–99)
Glucose-Capillary: 182 mg/dL — ABNORMAL HIGH (ref 65–99)
Glucose-Capillary: 209 mg/dL — ABNORMAL HIGH (ref 65–99)

## 2016-06-22 MED ORDER — POTASSIUM CHLORIDE CRYS ER 20 MEQ PO TBCR
40.0000 meq | EXTENDED_RELEASE_TABLET | Freq: Two times a day (BID) | ORAL | Status: DC
  Administered 2016-06-22 – 2016-06-24 (×6): 40 meq via ORAL
  Filled 2016-06-22 (×6): qty 2

## 2016-06-22 MED ORDER — SODIUM CHLORIDE 0.9 % IV SOLN
30.0000 meq | Freq: Once | INTRAVENOUS | Status: AC
  Administered 2016-06-22: 30 meq via INTRAVENOUS
  Filled 2016-06-22: qty 15

## 2016-06-22 NOTE — NC FL2 (Signed)
[place] MEDICAID FL2 LEVEL OF CARE SCREENING TOOL     IDENTIFICATION  Patient Name: Bryce Ramirez Birthdate: 1956/04/07 Sex: male Admission Date (Current Location): 06/16/2016  [facility] and [place] Number:  Producer, television/film/video and Address:  [facility] [place], [address] [place], [place] [address]      Provider Number: [id number]  Attending Physician Name and Address:  Kerin Perna, MD  Relative Name and Phone Number:       Current Level of Care: Hospital Recommended Level of Care: Skilled Nursing Facility Prior Approval Number:    Date Approved/Denied: 11/20/15 PASRR Number: [id number]  Discharge Plan: SNF    Current Diagnoses: Patient Active Problem List   Diagnosis Date Noted  . S/P CABG x 3 06/16/2016  . ST elevation myocardial infarction (STEMI) (HCC)     Orientation RESPIRATION BLADDER Height & Weight     Situation, Place, Time, Self  O2 (Nasal Cannula; 3L) Continent Weight: 199 lb 15.3 oz (90.7 kg) Height:  5\' 10"  (177.8 cm)  BEHAVIORAL SYMPTOMS/MOOD NEUROLOGICAL BOWEL NUTRITION STATUS      Continent  (Please see d/c summary.)  AMBULATORY STATUS COMMUNICATION OF NEEDS Skin   Limited Assist (+2 assist) Verbally Surgical wounds (Closed incision chest, no dressing. Closed incision left leg, no dressing.)                       Personal Care Assistance Level of Assistance  Bathing, Feeding, Dressing Bathing Assistance: Limited assistance Feeding assistance: Independent Dressing Assistance: Limited assistance     Functional Limitations Info  Sight, Hearing, Speech Sight Info: Adequate Hearing Info: Adequate Speech Info: Adequate    SPECIAL CARE FACTORS FREQUENCY  PT (By licensed PT), OT (By licensed OT)     PT Frequency: min 3x week OT Frequency: min 3x week            Contractures Contractures Info: Not present    Additional Factors Info  Code Status, Allergies Code Status Info: Full Allergies Info: No known  allergies           Current Medications (06/22/2016):  This is the current hospital active medication list Current Facility-Administered Medications  Medication Dose Route Frequency Provider Last Rate Last Dose  . 0.45 % sodium chloride infusion   Intravenous Continuous PRN Sharlene Dory, PA-C   Stopped at 06/19/16 1800  . 0.9 %  sodium chloride infusion  250 mL Intravenous Continuous Sharlene Dory, PA-C   Stopped at 06/17/16 0800  . 0.9 %  sodium chloride infusion   Intravenous Continuous Sharlene Dory, PA-C   Stopped at 06/19/16 1800  . aspirin EC tablet 325 mg  325 mg Oral Daily Sharlene Dory, PA-C   325 mg at 06/21/16 5621   Or  . aspirin chewable tablet 324 mg  324 mg Per Tube Daily Sharlene Dory, PA-C   324 mg at 06/18/16 0835  . bisacodyl (DULCOLAX) EC tablet 10 mg  10 mg Oral Daily Sharlene Dory, PA-C   10 mg at 06/21/16 3086   Or  . bisacodyl (DULCOLAX) suppository 10 mg  10 mg Rectal Daily Sharlene Dory, PA-C   10 mg at 06/17/16 0900  . buPROPion [facility] SR) 12 hr tablet 100 mg  100 mg Oral Daily Kerin Perna, MD   100 mg at 06/21/16 0932  . cefTAZidime (FORTAZ) 1 g in dextrose 5 % 50 mL IVPB  1 g Intravenous Q8H Peter  Donata Clay, MD   1 g at 06/22/16 0800  . docusate sodium (COLACE) capsule 200 mg  200 mg Oral Daily Sharlene Dory, PA-C   200 mg at 06/21/16 1308  . DULoxetine (CYMBALTA) DR capsule 90 mg  90 mg Oral Daily Kerin Perna, MD   90 mg at 06/21/16 0926  . enoxaparin (LOVENOX) injection 40 mg  40 mg Subcutaneous Q24H Kerin Perna, MD   40 mg at 06/21/16 2120  . fentaNYL (DURAGESIC - dosed mcg/hr) patch 50 mcg  50 mcg Transdermal Q72H Kerin Perna, MD   50 mcg at 06/18/16 1756  . furosemide (LASIX) injection 40 mg  40 mg Intravenous BID Kerin Perna, MD   40 mg at 06/22/16 0801  . gabapentin (NEURONTIN) capsule 300 mg  300 mg Oral BID Kerin Perna, MD   300 mg at 06/21/16 2119  . guaiFENesin (MUCINEX) 12 hr tablet 600 mg  600 mg Oral BID Kerin Perna, MD   600 mg at 06/21/16 2121  . insulin aspart (novoLOG) injection 0-5 Units  0-5 Units Subcutaneous QHS Kerin Perna, MD      . insulin aspart (novoLOG) injection 0-9 Units  0-9 Units Subcutaneous TID WC Kerin Perna, MD   2 Units at 06/21/16 1911  . insulin aspart (novoLOG) injection 3 Units  3 Units Subcutaneous TID WC Kerin Perna, MD   3 Units at 06/21/16 1910  . insulin detemir (LEVEMIR) injection 22 Units  22 Units Subcutaneous BID Kerin Perna, MD   22 Units at 06/21/16 2119  . lisinopril (PRINIVIL,ZESTRIL) tablet 5 mg  5 mg Oral Daily Kerin Perna, MD   5 mg at 06/21/16 0926  . MEDLINE mouth rinse  15 mL Mouth Rinse q12n4p Kerin Perna, MD   15 mL at 06/21/16 1514  . metolazone (ZAROXOLYN) tablet 5 mg  5 mg Oral Daily Kerin Perna, MD   5 mg at 06/22/16 0800  . metoprolol (LOPRESSOR) injection 2.5-5 mg  2.5-5 mg Intravenous Q2H PRN Sharlene Dory, PA-C   5 mg at 06/19/16 1104  . metoprolol tartrate (LOPRESSOR) tablet 12.5 mg  12.5 mg Oral BID Sharlene Dory, PA-C   12.5 mg at 06/21/16 2119   Or  . metoprolol tartrate (LOPRESSOR) 25 mg/10 mL oral suspension 12.5 mg  12.5 mg Per Tube BID Sharlene Dory, PA-C   12.5 mg at 06/20/16 2150  . midazolam (VERSED) injection 1 mg  1 mg Intravenous Q1H PRN Kerin Perna, MD      . milrinone (PRIMACOR) 20 MG/100 ML (0.2 mg/mL) infusion  0.25 mcg/kg/min Intravenous Continuous Kerin Perna, MD 6.3 mL/hr at 06/22/16 0600 0.25 mcg/kg/min at 06/22/16 0600  . ondansetron (ZOFRAN) injection 4 mg  4 mg Intravenous Q6H PRN Sharlene Dory, PA-C   4 mg at 06/19/16 1112  . oxyCODONE (Oxy IR/ROXICODONE) immediate release tablet 5-10 mg  5-10 mg Oral Q3H PRN Sharlene Dory, PA-C   10 mg at 06/19/16 2137  . pantoprazole (PROTONIX) EC tablet 40 mg  40 mg Oral Q1200 Kerin Perna, MD   40 mg at 06/21/16 1145  . pneumococcal 23 valent vaccine (PNU-IMMUNE) injection 0.5 mL  0.5 mL Intramuscular Tomorrow-1000 Kerin Perna, MD      .  potassium chloride 30 mEq in sodium chloride 0.9 % 265 mL (KCL MULTIRUN) IVPB  30 mEq Intravenous Once Kerin Perna, MD   30 mEq  at 06/22/16 0803  . sodium chloride flush (NS) 0.9 % injection 10-40 mL  10-40 mL Intracatheter Q12H Kerin PernaPeter Van Trigt, MD   10 mL at 06/21/16 2121  . sodium chloride flush (NS) 0.9 % injection 10-40 mL  10-40 mL Intracatheter PRN Kerin PernaPeter Van Trigt, MD      . sodium chloride flush (NS) 0.9 % injection 3 mL  3 mL Intravenous Q12H Sharlene Doryessa N Conte, PA-C   3 mL at 06/21/16 2121  . sodium chloride flush (NS) 0.9 % injection 3 mL  3 mL Intravenous PRN Sharlene Doryessa N Conte, PA-C      . thiamine (VITAMIN B-1) tablet 100 mg  100 mg Oral Daily Kerin PernaPeter Van Trigt, MD   100 mg at 06/21/16 0926  . traMADol (ULTRAM) tablet 50-100 mg  50-100 mg Oral Q4H PRN Sharlene Doryessa N Conte, PA-C   100 mg at 06/19/16 1612     Discharge Medications: Please see discharge summary for a list of discharge medications.  Relevant Imaging Results:  Relevant Lab Results:   Additional Information SSN: [id number]  Ht: 5\' 10"  (1.778 m)  Wt: 199 lb 15.3 oz (90.7 kg)      Lonni Dirden A Mayton, LCSW

## 2016-06-22 NOTE — Progress Notes (Signed)
6 Days Post-Op Procedure(s) (LRB): CORONARY ARTERY BYPASS GRAFTING (CABG) x 3 (N/A) ENDOVEIN HARVEST OF GREATER SAPHENOUS VEIN (Left) Subjective:  No complaints  Objective: Vital signs in last 24 hours: Temp:  [97.4 F (36.3 C)-98.4 F (36.9 C)] 98.2 F (36.8 C) (01/13 0817) Pulse Rate:  [81-89] 83 (01/13 0700) Cardiac Rhythm: Normal sinus rhythm (01/13 0400) Resp:  [11-28] 17 (01/13 0700) BP: (106-158)/(53-79) 124/61 (01/13 0700) SpO2:  [82 %-99 %] 94 % (01/13 0700) Weight:  [90.7 kg (199 lb 15.3 oz)] 90.7 kg (199 lb 15.3 oz) (01/13 0343)  Hemodynamic parameters for last 24 hours:    Intake/Output from previous day: 01/12 0701 - 01/13 0700 In: 916.2 [P.O.:720; I.V.:146.2; IV Piggyback:50] Out: 4140 [Urine:4100; Chest Tube:40] Intake/Output this shift: No intake/output data recorded.  General appearance: alert and cooperative Neurologic: intact Heart: regular rate and rhythm, S1, S2 normal, no murmur, click, rub or gallop Lungs: diminished breath sounds bibasilar Abdomen: soft, non-tender; bowel sounds normal; no masses,  no organomegaly Extremities: edema mild, right BKA Wound: incision ok chest tube output low  Lab Results:  Recent Labs  06/21/16 0457 06/21/16 1556 06/22/16 0340  WBC 12.0*  --  13.0*  HGB 10.7* 11.2* 10.8*  HCT 33.3* 33.0* 32.8*  PLT 285  --  315   BMET:  Recent Labs  06/21/16 0457 06/21/16 1556 06/22/16 0340  NA 140 138 138  K 3.5 3.9 3.3*  CL 97* 91* 90*  CO2 34*  --  36*  GLUCOSE 146* 228* 138*  BUN 26* 25* 24*  CREATININE 0.82 0.90 0.94  CALCIUM 8.2*  --  8.2*    PT/INR: No results for input(s): LABPROT, INR in the last 72 hours. ABG    Component Value Date/Time   PHART 7.383 06/18/2016 1637   HCO3 26.0 06/18/2016 1637   TCO2 36 06/21/2016 1556   O2SAT 58.0 06/22/2016 0335   CBG (last 3)   Recent Labs  06/21/16 1636 06/21/16 2118 06/22/16 0815  GLUCAP 187* 154* 93    Assessment/Plan: S/P Procedure(s)  (LRB): CORONARY ARTERY BYPASS GRAFTING (CABG) x 3 (N/A) ENDOVEIN HARVEST OF GREATER SAPHENOUS VEIN (Left)  He remains hemodynamically stable with Co-ox 58% on milrinone 0.25. Will decrease to 0.125 today and repeat Co-ox in am.  Continue diuresis with lasix and metolazone. Replace K+  Continue Fortaz for possible right lung pneumonia. Sputum culture pending.  DC right chest tube.  Diabetes under adequate control.  Mobilize.   LOS: 6 days    Alleen BorneBryan K Bartle 06/22/2016

## 2016-06-22 NOTE — Progress Notes (Signed)
Patient ID: Bryce Ramirez, male   DOB: 01/31/1956, 61 y.o.   MRN: [id number]   SICU Evening Rounds:   Hemodynamically stable on Milrinone 0.125  Urine output excellent with diuretics   CBC    Component Value Date/Time   WBC 13.0 (H) 06/22/2016 0340   RBC 3.69 (L) 06/22/2016 0340   HGB 12.6 (L) 06/22/2016 1654   HCT 37.0 (L) 06/22/2016 1654   PLT 315 06/22/2016 0340   MCV 88.9 06/22/2016 0340   MCH 29.3 06/22/2016 0340   MCHC 32.9 06/22/2016 0340   RDW 13.1 06/22/2016 0340   LYMPHSABS 1.5 06/16/2016 1500   MONOABS 1.4 (H) 06/16/2016 1500   EOSABS 0.1 06/16/2016 1500   BASOSABS 0.0 06/16/2016 1500     BMET    Component Value Date/Time   NA 134 (L) 06/22/2016 1654   K 4.1 06/22/2016 1654   CL 89 (L) 06/22/2016 1654   CO2 36 (H) 06/22/2016 0340   GLUCOSE 320 (H) 06/22/2016 1654   BUN 28 (H) 06/22/2016 1654   CREATININE 1.10 06/22/2016 1654   CALCIUM 8.2 (L) 06/22/2016 0340   GFRNONAA >60 06/22/2016 0340   GFRAA >60 06/22/2016 0340     A/P:  Stable postop course. Continue current plans

## 2016-06-23 ENCOUNTER — Inpatient Hospital Stay (HOSPITAL_COMMUNITY): Payer: BLUE CROSS/BLUE SHIELD

## 2016-06-23 LAB — COMPREHENSIVE METABOLIC PANEL
ALT: 35 U/L (ref 17–63)
AST: 31 U/L (ref 15–41)
Albumin: 2.3 g/dL — ABNORMAL LOW (ref 3.5–5.0)
Alkaline Phosphatase: 120 U/L (ref 38–126)
Anion gap: 9 (ref 5–15)
BUN: 25 mg/dL — ABNORMAL HIGH (ref 6–20)
CO2: 36 mmol/L — ABNORMAL HIGH (ref 22–32)
Calcium: 8.6 mg/dL — ABNORMAL LOW (ref 8.9–10.3)
Chloride: 91 mmol/L — ABNORMAL LOW (ref 101–111)
Creatinine, Ser: 0.9 mg/dL (ref 0.61–1.24)
GFR calc Af Amer: >60 mL/min (ref >60)
GFR calc non Af Amer: >60 mL/min (ref >60)
Glucose, Bld: 215 mg/dL — ABNORMAL HIGH (ref 65–99)
Potassium: 4 mmol/L (ref 3.5–5.1)
Sodium: 136 mmol/L (ref 135–145)
Total Bilirubin: 0.5 mg/dL (ref 0.3–1.2)
Total Protein: 5.9 g/dL — ABNORMAL LOW (ref 6.5–8.1)

## 2016-06-23 LAB — GLUCOSE, CAPILLARY
Glucose-Capillary: 147 mg/dL — ABNORMAL HIGH (ref 65–99)
Glucose-Capillary: 273 mg/dL — ABNORMAL HIGH (ref 65–99)
Glucose-Capillary: 169 mg/dL — ABNORMAL HIGH (ref 65–99)

## 2016-06-23 LAB — POCT I-STAT, CHEM 8
BUN: 25 mg/dL — ABNORMAL HIGH (ref 6–20)
CHLORIDE: 89 mmol/L — AB (ref 101–111)
Calcium, Ion: 1.25 mmol/L (ref 1.15–1.40)
Creatinine, Ser: 1.1 mg/dL (ref 0.61–1.24)
Glucose, Bld: 254 mg/dL — ABNORMAL HIGH (ref 65–99)
HEMATOCRIT: 37 % — AB (ref 39.0–52.0)
Hemoglobin: 12.6 g/dL — ABNORMAL LOW (ref 13.0–17.0)
Potassium: 4.5 mmol/L (ref 3.5–5.1)
SODIUM: 137 mmol/L (ref 135–145)
TCO2: 39 mmol/L (ref 0–100)

## 2016-06-23 LAB — CBC
HCT: 33.6 % — ABNORMAL LOW (ref 39.0–52.0)
Hemoglobin: 11 g/dL — ABNORMAL LOW (ref 13.0–17.0)
MCH: 29.3 pg (ref 26.0–34.0)
MCHC: 32.7 g/dL (ref 30.0–36.0)
MCV: 89.4 fL (ref 78.0–100.0)
Platelets: 374 10*3/uL (ref 150–400)
RBC: 3.76 MIL/uL — ABNORMAL LOW (ref 4.22–5.81)
RDW: 13.3 % (ref 11.5–15.5)
WBC: 12.4 10*3/uL — ABNORMAL HIGH (ref 4.0–10.5)

## 2016-06-23 LAB — COOXEMETRY PANEL
Carboxyhemoglobin: 0.8 % (ref 0.5–1.5)
Methemoglobin: 1.1 % (ref 0.0–1.5)
O2 Saturation: 59.2 %
Total hemoglobin: 10.8 g/dL — ABNORMAL LOW (ref 12.0–16.0)

## 2016-06-23 MED ORDER — INSULIN ASPART 100 UNIT/ML ~~LOC~~ SOLN
6.0000 [IU] | Freq: Three times a day (TID) | SUBCUTANEOUS | Status: DC
  Administered 2016-06-23 – 2016-06-27 (×11): 6 [IU] via SUBCUTANEOUS

## 2016-06-23 MED ORDER — METOLAZONE 5 MG PO TABS: 5.0000 mg | ORAL_TABLET | Freq: Every day | ORAL | Status: DC

## 2016-06-23 NOTE — Progress Notes (Signed)
7 Days Post-Op Procedure(s) (LRB): CORONARY ARTERY BYPASS GRAFTING (CABG) x 3 (N/A) ENDOVEIN HARVEST OF GREATER SAPHENOUS VEIN (Left) Subjective:  No complaints  Objective: Vital signs in last 24 hours: Temp:  [98.1 F (36.7 C)-98.4 F (36.9 C)] 98.4 F (36.9 C) (01/14 0801) Pulse Rate:  [78-102] 82 (01/14 0700) Cardiac Rhythm: Normal sinus rhythm (01/14 0400) Resp:  [14-39] 25 (01/14 0700) BP: (90-141)/(56-89) 141/66 (01/14 0700) SpO2:  [87 %-98 %] 97 % (01/14 0700) Weight:  [89.6 kg (197 lb 8.5 oz)] 89.6 kg (197 lb 8.5 oz) (01/14 0400)  Hemodynamic parameters for last 24 hours:    Intake/Output from previous day: 01/13 0701 - 01/14 0700 In: 1222.2 [P.O.:720; I.V.:87.2; IV Piggyback:415] Out: 3950 [Urine:3950] Intake/Output this shift: No intake/output data recorded.  General appearance: alert and cooperative Neurologic: intact Heart: regular rate and rhythm, S1, S2 normal, no murmur, click, rub or gallop Lungs: clear to auscultation bilaterally Extremities: edema mild Wound: incision healing well  Lab Results:  Recent Labs  06/22/16 0340 06/22/16 1654 06/23/16 0400  WBC 13.0*  --  12.4*  HGB 10.8* 12.6* 11.0*  HCT 32.8* 37.0* 33.6*  PLT 315  --  374   BMET:  Recent Labs  06/22/16 0340 06/22/16 1654 06/23/16 0400  NA 138 134* 136  K 3.3* 4.1 4.0  CL 90* 89* 91*  CO2 36*  --  36*  GLUCOSE 138* 320* 215*  BUN 24* 28* 25*  CREATININE 0.94 1.10 0.90  CALCIUM 8.2*  --  8.6*    PT/INR: No results for input(s): LABPROT, INR in the last 72 hours. ABG    Component Value Date/Time   PHART 7.383 06/18/2016 1637   HCO3 26.0 06/18/2016 1637   TCO2 39 06/22/2016 1654   O2SAT 59.2 06/23/2016 0418   CBG (last 3)   Recent Labs  06/22/16 1618 06/22/16 2104 06/23/16 0756  GLUCAP 307* 209* 147*   CLINICAL DATA:  CABG.  Diabetes.  Coronary artery disease.  EXAM: PORTABLE CHEST 1 VIEW  COMPARISON:  One day  prior  FINDINGS: Right-sided  subclavian line terminates at the caval/atrial junction. Prior median sternotomy. Midline trachea. Cardiomegaly accentuated by AP portable technique. Interval removal of right-sided chest tube. Probable small right pleural effusion, similar. No pneumothorax. Mild interstitial edema is new or increased. Improvement in right base and similar left base airspace disease.  IMPRESSION: Slightly improved right-sided aeration with persistent bibasilar atelectasis or infection.  Removal of right chest tube with small right-sided pleural effusion remaining. No pneumothorax.  Developing interstitial edema.   Electronically Signed   By: Jeronimo GreavesKyle  Talbot M.D.   On: 06/23/2016 07:39  Assessment/Plan: S/P Procedure(s) (LRB): CORONARY ARTERY BYPASS GRAFTING (CABG) x 3 (N/A) ENDOVEIN HARVEST OF GREATER SAPHENOUS VEIN (Left)  He is hemodynamically stable on milrinone 0.125 with Co-ox 59%. Will stop it today.   Still has significant volume excess but is diuresing well with lasix and metolazone. Weight is down 2.5 lbs today but still 12.5 lbs over preop. Continue diuretic and K+ replacement.  DM: glucose still elevated at times and went up to 320 yesterday afternoon once before coming back down. Will increase meal coverage to 6 units.  On Elita QuickFortaz for possible pneumonia. Sputum culture normal flora. WBC ct decreasing. Remains afebrile. Continue IS.     LOS: 7 days    Alleen BorneBryan K Malikye Reppond 06/23/2016

## 2016-06-23 NOTE — Progress Notes (Signed)
Patient ID: Bryce Ramirez, male   DOB: 04/02/1956, 61 y.o.   MRN: [id number]  SICU Evening Rounds:   Hemodynamically stable off Milrinone  Urine output good    CBC    Component Value Date/Time   WBC 12.4 (H) 06/23/2016 0400   RBC 3.76 (L) 06/23/2016 0400   HGB 12.6 (L) 06/23/2016 1653   HCT 37.0 (L) 06/23/2016 1653   PLT 374 06/23/2016 0400   MCV 89.4 06/23/2016 0400   MCH 29.3 06/23/2016 0400   MCHC 32.7 06/23/2016 0400   RDW 13.3 06/23/2016 0400   LYMPHSABS 1.5 06/16/2016 1500   MONOABS 1.4 (H) 06/16/2016 1500   EOSABS 0.1 06/16/2016 1500   BASOSABS 0.0 06/16/2016 1500     BMET    Component Value Date/Time   NA 137 06/23/2016 1653   K 4.5 06/23/2016 1653   CL 89 (L) 06/23/2016 1653   CO2 36 (H) 06/23/2016 0400   GLUCOSE 254 (H) 06/23/2016 1653   BUN 25 (H) 06/23/2016 1653   CREATININE 1.10 06/23/2016 1653   CALCIUM 8.6 (L) 06/23/2016 0400   GFRNONAA >60 06/23/2016 0400   GFRAA >60 06/23/2016 0400     A/P:  Stable postop course. Continue current plans

## 2016-06-24 ENCOUNTER — Inpatient Hospital Stay (HOSPITAL_COMMUNITY): Payer: BLUE CROSS/BLUE SHIELD

## 2016-06-24 DIAGNOSIS — I213 ST elevation (STEMI) myocardial infarction of unspecified site: Secondary | ICD-10-CM

## 2016-06-24 LAB — CBC
HEMATOCRIT: 36.7 % — AB (ref 39.0–52.0)
HEMOGLOBIN: 11.9 g/dL — AB (ref 13.0–17.0)
MCH: 29 pg (ref 26.0–34.0)
MCHC: 32.4 g/dL (ref 30.0–36.0)
MCV: 89.3 fL (ref 78.0–100.0)
Platelets: 448 10*3/uL — ABNORMAL HIGH (ref 150–400)
RBC: 4.11 MIL/uL — AB (ref 4.22–5.81)
RDW: 13.4 % (ref 11.5–15.5)
WBC: 12.9 10*3/uL — AB (ref 4.0–10.5)

## 2016-06-24 LAB — GLUCOSE, CAPILLARY
GLUCOSE-CAPILLARY: 216 mg/dL — AB (ref 65–99)
GLUCOSE-CAPILLARY: 124 mg/dL — AB (ref 65–99)
GLUCOSE-CAPILLARY: 61 mg/dL — AB (ref 65–99)
GLUCOSE-CAPILLARY: 131 mg/dL — AB (ref 65–99)
Glucose-Capillary: 134 mg/dL — ABNORMAL HIGH (ref 65–99)

## 2016-06-24 LAB — COOXEMETRY PANEL
Carboxyhemoglobin: 0.7 % (ref 0.5–1.5)
Methemoglobin: 1.2 % (ref 0.0–1.5)
O2 Saturation: 61 %
Total hemoglobin: 12.2 g/dL (ref 12.0–16.0)

## 2016-06-24 LAB — BASIC METABOLIC PANEL
ANION GAP: 10 (ref 5–15)
BUN: 24 mg/dL — AB (ref 6–20)
CO2: 36 mmol/L — AB (ref 22–32)
Calcium: 9 mg/dL (ref 8.9–10.3)
Chloride: 92 mmol/L — ABNORMAL LOW (ref 101–111)
Creatinine, Ser: 0.88 mg/dL (ref 0.61–1.24)
GFR calc Af Amer: >60 mL/min (ref >60)
GLUCOSE: 155 mg/dL — AB (ref 65–99)
POTASSIUM: 4.7 mmol/L (ref 3.5–5.1)
Sodium: 138 mmol/L (ref 135–145)

## 2016-06-24 MED ORDER — SODIUM CHLORIDE 0.9% FLUSH: 3.0000 mL | INTRAVENOUS | Status: DC | PRN

## 2016-06-24 MED ORDER — MOVING RIGHT ALONG BOOK
Freq: Once | Status: AC
  Administered 2016-06-24: 16:00:00
  Filled 2016-06-24: qty 1

## 2016-06-24 MED ORDER — SODIUM CHLORIDE 0.9% FLUSH: 3.0000 mL | Freq: Two times a day (BID) | INTRAVENOUS | Status: DC

## 2016-06-24 MED ORDER — FUROSEMIDE 10 MG/ML IJ SOLN
40.0000 mg | Freq: Every day | INTRAMUSCULAR | Status: DC
  Administered 2016-06-24: 40 mg via INTRAVENOUS
  Filled 2016-06-24: qty 4

## 2016-06-24 MED ORDER — SODIUM CHLORIDE 0.9 % IV SOLN: 250.0000 mL | INTRAVENOUS | Status: DC | PRN

## 2016-06-24 NOTE — Discharge Summary (Signed)
Physician Discharge Summary  Patient ID: Bryce Ramirez MRN: [id number] DOB/AGE: June 30, 1955 61 y.o.  Admit date: 06/16/2016 Discharge date: 06/27/2016  Admission Diagnoses: Patient Active Problem List   Diagnosis Date Noted    06/16/2016  . ST elevation myocardial infarction (STEMI) [facility])    Discharge Diagnoses:  Active Problems:   ST elevation myocardial infarction (STEMI) (HCC)   S/P CABG x 3   Discharged Condition: stable  HPI:  61 year old type I diabetic Caucasian male form smoker, reformed alcoholic with known history of CAD status post prior PCI stents in [place] 2009 presents with chest pain shortness of breath. In the emergency department he was found to have a ST elevation MI with positive enzymes and EKG changes. Chest x-ray showed pulmonary edema and he was brought directly to the cath Ramirez. He was found have a 95% distal left main stenosis, proximal 95% stenosis of the RCA and 99% ostial stenosis of the LAD and circumflex. Previously placed stents have high-grade stenoses. EF is 20% with LVEDP 27 mmHg. Cardiac cath was performed via right femoral artery. A balloon pump was then placed to the right femoral artery sheath to provide hemodynamic support. Bedside echo shows biventricular dysfunction, severe without significant MR The patient 100% Rebreather Mask with PaO2 of 68, He Is Not Acidotic.  Emergency multivessel CABG was recommended by his cardiologist Dr. Kendra Ramirez. I agreed with his recommendation and discussed emergency CABG with the patient for treatment of his severe CAD and severe LV dysfunction. He understands that emergency CABG is his best therapy for long-term survival, preservation of ventricular function, and best treatment for symptoms of coronary disease. The patient understands this emergency operation is at high risk of major morbidity or mortality of 15-20 percent-risk of stroke, bleeding requiring blood transfusion, prolonged ventilator dependence, postop  pulmonary problems including pneumonia or pleural effusion, wound infection, multisystem organ failure, and death. He understands these issues and agrees to proceed with surgery. I discussed the operation with his sister Bryce Ramirez at her home in the [place] area.    Hospital Course:  On January 7th 2018 Bryce Ramirez underwent an emergent coronary grafting x3 by Dr. Tharon Aquas Ramirez.  He tolerated the procedure well and was transferred to CVICU. On postop day 1 he remained on the vent and his intra-aortic balloon pump remained in place. His oxygenation improved and we continued his diuresis. On POD 2 he was extubated successfully. He remained on some milrinone and epinephrine. His creatinine remained stable. We continued his chest tubes due to high output. On POD 3 we weaned his balloon pump to 1:3, he remained hemodynamically stable. He remained on inotropic support and we were able to remove the balloon pump. We discontinued his a-line and weaned his oxygen as tolerated. He continued to diurese well. On POD 4 we were able to discontinue his chest tubes. We reduced his levemir and titrated his insulin according to his blood glucose level. He will need to follow up with his medical doctor in [place] after discharge. We continued to wean is milrinone as tolerated. We added Bryce Ramirez for possible pneumonia and checked a sputum culture which was negative. On POD 7 we were able to discontinue the milrinone. His blood glucose level remained uncontrolled and we had to adjust his insulin regimen several times. Per inpatient diabetes recommendations, will continue Levemir 20 units SQ bid and decrease meal coverage to 4 units tid with meals. He will need to follow up with a medical doctor after discharge regarding  further diabetes management and surveillance of his HGA!C.  On POD 8, we discussed SNF placement with case management. 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 He remained in normal sinus rhythm. Today, he is felt ready for discharge to SNF.   We ask the SNF to please do the following: 1. Please obtain vital signs at least one time daily 2.Please weigh the patient daily. If he or she continues to gain weight or develops lower extremity edema, contact the office at [phone]. 3. Ambulate patient at least three times daily and please use sternal precautions.  Consults: cardiology  Significant Diagnostic Studies:   Cardiac Cath 06/16/2016  1. ST elevation MI 2. Severe stenosis distal left main artery 3. Severe stenosis ostial LAD, mid LAD. Patent stent proximal LAD 4. Severe stenosis ostial Circumflex. Severe stenosis first obtuse marginal. Severe stenosis stented segment of second obtuse marginal.  5. Severe stenosis proximal RCA. Faint filling of distal RCA from left to right collaterals.  6. Severe LV systolic dysfunction with elevated LVEDP 7. IABP placement  Recommendations: He will need emergent CABG. CT surgery is at the bedside. STAT echo prior to CABG. IV heparin prior to OR.   Treatments: NAME:  Bryce, Ramirez NO.:  [id number]  MEDICAL RECORD NO.:  [id number]  LOCATION:  ED                           FACILITY:  [facility]  PHYSICIAN:  Bryce Ramirez, M.D.  DATE OF BIRTH:  [birth year]  DATE OF PROCEDURE:  06/16/2016 DATE OF DISCHARGE:                              OPERATIVE REPORT   OPERATION: 1. Emergency coronary artery bypass grafting x3 (left internal mammary     artery to left anterior descending, saphenous vein graft to     circumflex marginal, saphenous vein graft to right coronary and     posterior descending). 2. Endoscopic harvest of left leg greater saphenous vein.  PREOPERATIVE DIAGNOSES:  Severe left main and three-vessel coronary artery disease, acute myocardial infarction, severe left ventricular dysfunction ejection fraction 15%, preoperative cardiogenic shock  with preoperative balloon pump placed in the cath Ramirez.  POSTOPERATIVE DIAGNOSES:  Severe left main and three-vessel coronary artery disease, acute myocardial infarction, severe left ventricular dysfunction ejection fraction 15%, preoperative cardiogenic shock with preoperative balloon pump placed in the cath Ramirez.  ANESTHESIA:  General by Dr. Annye Asa.  SURGEON:  Bryce Poot, MD.  ASSISTANT:  Nicholes Rough, PA-C.  Discharge Exam: Blood pressure 104/64, pulse 77, temperature 98 F (36.7 C), temperature source Oral, resp. rate 16, height 5' 10"  (1.778 m), weight 179 lb 1.6 oz (81.2 kg), SpO2 91 %.   Cardiovascular: RRR Pulmonary: Slightly diminished at bases Abdomen: Soft, non tender, bowel sounds present. Extremities: No lower left extremity edema. Wounds: Clean and dry.  No erythema or signs of infection.   Disposition: Stable and discharged to SNF.  Discharge Instructions    Amb Referral to Cardiac Rehabilitation    Complete by:  As directed    Diagnosis:   CABG Comment - To Kristine Royal STEMI     CABG X ___:  3     Allergies as of 06/27/2016  No Known Allergies     Medication List    STOP taking these medications   amLODipine 5 MG tablet Commonly known as:  NORVASC   carvedilol 25 MG tablet Commonly known as:  COREG   clopidogrel 75 MG tablet Commonly known as:  PLAVIX   hydrochlorothiazide 25 MG tablet Commonly known as:  HYDRODIURIL   ibuprofen 600 MG tablet Commonly known as:  ADVIL,MOTRIN   insulin glargine 100 unit/mL Sopn Commonly known as:  LANTUS   insulin pump Soln   phenylephrine 10 MG Tabs tablet Commonly known as:  SUDAFED PE   Potassium 99 MG Tabs     TAKE these medications   aspirin 325 MG EC tablet Take 1 tablet (325 mg total) by mouth daily. What changed:  medication strength  how much to take   atorvastatin 80 MG tablet Commonly known as:  LIPITOR Take 80 mg by mouth at bedtime.   buPROPion 100 MG  12 hr tablet Commonly known as:  WELLBUTRIN SR Take 100 mg by mouth daily.   diazepam 5 MG/ML solution Commonly known as:  VALIUM Take 5 mg by mouth once as needed (seizures).   DULoxetine 30 MG capsule Commonly known as:  CYMBALTA Take 90 mg by mouth daily.   Fish Oil 1000 MG Caps Take 2,000 mg by mouth daily.   fluticasone 50 MCG/ACT nasal spray Commonly known as:  FLONASE Place 1 spray into both nostrils See admin instructions. Ordered 06/12/16: instill 1 spray into each nostril daily as needed for congestion   furosemide 20 MG tablet Commonly known as:  LASIX Take 1 tablet (20 mg total) by mouth daily. For 5 days then stop.   gabapentin 300 MG capsule Commonly known as:  NEURONTIN Take 300 mg by mouth 2 (two) times daily.   guaiFENesin 600 MG 12 hr tablet Commonly known as:  MUCINEX Take 600 mg by mouth 2 (two) times daily as needed (congestion). 9am, 5pm   insulin aspart 100 UNIT/ML injection Commonly known as:  novoLOG Inject 4 Units into the skin 3 (three) times daily with meals.   insulin detemir 100 UNIT/ML injection Commonly known as:  LEVEMIR Inject 0.2 mLs (20 Units total) into the skin 2 (two) times daily.   lisinopril 2.5 MG tablet Commonly known as:  PRINIVIL,ZESTRIL Take 1 tablet (2.5 mg total) by mouth daily.   metoprolol tartrate 25 MG tablet Commonly known as:  LOPRESSOR Take 0.5 tablets (12.5 mg total) by mouth 2 (two) times daily.   Multiple Vitamin-Folic Acid Tabs Take 1 tablet by mouth daily.   oxyCODONE 5 MG immediate release tablet Commonly known as:  Oxy IR/ROXICODONE Take one table by mouth every 4-6 hours PRN severe pain.   thiamine 100 MG tablet Take 100 mg by mouth daily.   traZODone 50 MG tablet Commonly known as:  DESYREL Take 50 mg by mouth at bedtime.     The patient has been discharged on:   1.Beta Blocker:  Yes [ x  ]                              No   [   ]                              If No, reason:  2.Ace  Inhibitor/ARB: Yes [ x  ]  No  [    ]                                     If No, reason:  3.Statin:   Yes [   ]                  No  [ x  ]                  If No, reason:  4.Ecasa:  Yes  [  x ]                  No   [   ]                  If No, reason:   Follow-up Information    Bryce Ramirez III, MD Follow up on 07/03/2016.   Specialty:  Cardiothoracic Surgery Why:  PA/LAT CXR to be taken (at [facility] which is in the same building as Dr. Lucianne Lei Ramirez's office) on 07/03/2016 1:00 pm;Appointment time is at 1:30 pm Contact information: [place] [address] [phone]        Brittainy Simmons, PA-C Follow up on 07/11/2016.   Specialties:  Cardiology, Radiology Why:  Appointment time is at 9:00 am Contact information: [place] [address] [phone]        Medical Doctor Follow up.   Why:  Call for an appointment regarding further diabetes management and surveillanc of HGA1C             Signed: Nani Skillern PA-C 06/27/2016, 10:40 AM

## 2016-06-24 NOTE — Progress Notes (Signed)
Pt arrived to 2w from 2s. Telemetry box applied and CCMD notified. Pt oriented to room. Pt denies pain at this time. Will continue current plan of care.  Berdine DanceLauren Moffitt BSN, RN

## 2016-06-24 NOTE — Progress Notes (Signed)
8 Days Post-Op Procedure(s) (LRB): CORONARY ARTERY BYPASS GRAFTING (CABG) x 3 (N/A) ENDOVEIN HARVEST OF GREATER SAPHENOUS VEIN (Left) Subjective: Sig diuresis BKA prosthesis now may fit PT to help with ambulation, sternal precautions SNF when ambulatory Objective: Vital signs in last 24 hours: Temp:  [98 F (36.7 C)-98.9 F (37.2 C)] 98.9 F (37.2 C) (01/15 0824) Pulse Rate:  [73-104] 80 (01/15 0800) Cardiac Rhythm: Normal sinus rhythm (01/15 0200) Resp:  [12-32] 18 (01/15 0800) BP: (94-129)/(51-98) 94/62 (01/15 0800) SpO2:  [80 %-99 %] 97 % (01/15 0800) Weight:  [178 lb 12.7 oz (81.1 kg)] 178 lb 12.7 oz (81.1 kg) (01/15 0500)  Hemodynamic parameters for last 24 hours:  nsr  Intake/Output from previous day: 01/14 0701 - 01/15 0700 In: 170 [IV Piggyback:100] Out: 3476 [Urine:3475; Stool:1] Intake/Output this shift: No intake/output data recorded.  Incisions clean  Edema better  CXR improved  Lab Results:  Recent Labs  06/23/16 0400 06/23/16 1653 06/24/16 0451  WBC 12.4*  --  12.9*  HGB 11.0* 12.6* 11.9*  HCT 33.6* 37.0* 36.7*  PLT 374  --  448*   BMET:  Recent Labs  06/23/16 0400 06/23/16 1653 06/24/16 0451  NA 136 137 138  K 4.0 4.5 4.7  CL 91* 89* 92*  CO2 36*  --  36*  GLUCOSE 215* 254* 155*  BUN 25* 25* 24*  CREATININE 0.90 1.10 0.88  CALCIUM 8.6*  --  9.0    PT/INR: No results for input(s): LABPROT, INR in the last 72 hours. ABG    Component Value Date/Time   PHART 7.383 06/18/2016 1637   HCO3 26.0 06/18/2016 1637   TCO2 39 06/23/2016 1653   O2SAT 61.0 06/24/2016 0440   CBG (last 3)   Recent Labs  06/23/16 1208 06/23/16 2135 06/24/16 0822  GLUCAP 273* 169* 134*    Assessment/Plan: S/P Procedure(s) (LRB): CORONARY ARTERY BYPASS GRAFTING (CABG) x 3 (N/A) ENDOVEIN HARVEST OF GREATER SAPHENOUS VEIN (Left) Ambulate with PT SNF  lasix one dose daily   LOS: 8 days    Bryce Ramirez 06/24/2016

## 2016-06-24 NOTE — Progress Notes (Signed)
Physical Therapy Treatment Patient Details Name: Bryce Ramirez MRN: [id number] DOB: 07/17/1955 Today's Date: 06/24/2016    History of Present Illness 61 yo with STEMI s/p emergent CABGx3. PMhx: Rt BKA, DM, HTN, CAD    PT Comments    Pt very pleasant with decreased edema to RLE. Pt noted to have compression stocking in place and unable to state if it has been removed since I last saw him on 1/12. Stocking and leg wet with perspiration and sour smelling. Pt wears compression stocking under liner and socks at baseline for prosthesis wear per pt report and utilized sequence as such for gait with compression stocking removed, washed and hung to dry end of session. Pt educated for need to maintain hygiene of prosthetic layers for skin integrity. Pt educated for all sternal precautions, transfers and gait with great improvement in mobility now that prosthesis able to be donned. Will continue to follow.   HR 94-97 sats 91-95% on RA 123/58 pre 113/63 post  Follow Up Recommendations  SNF;Supervision/Assistance - 24 hour     Equipment Recommendations  Rolling walker with 5" wheels    Recommendations for Other Services       Precautions / Restrictions Precautions Precautions: Fall;Sternal Precaution Comments: Rt BKA    Mobility  Bed Mobility Overal bed mobility: Needs Assistance Bed Mobility: Supine to Sit     Supine to sit: Min assist     General bed mobility comments: cues for sequence and precautions with assist to elevate trunk   Transfers Overall transfer level: Needs assistance   Transfers: Sit to/from Stand Sit to Stand: Min assist         General transfer comment: cues for hand placement and sequence x 2 trials   Ambulation/Gait Ambulation/Gait assistance: Min guard Ambulation Distance (Feet): 300 Feet Assistive device: Rolling walker (2 wheeled) Gait Pattern/deviations: Step-through pattern;Decreased stance time - right;Trunk flexed   Gait velocity  interpretation: Below normal speed for age/gender General Gait Details: cues for posture and position in Rw as well as cues to look up. pt denied additional ambulation due to fatigue   Stairs            Wheelchair Mobility    Modified Rankin (Stroke Patients Only)       Balance     Sitting balance-Leahy Scale: Good       Standing balance-Leahy Scale: Fair                      Cognition Arousal/Alertness: Awake/alert Behavior During Therapy: WFL for tasks assessed/performed Overall Cognitive Status: Impaired/Different from baseline Area of Impairment: Memory   Current Attention Level: Alternating Memory: Decreased recall of precautions Following Commands: Follows one step commands consistently            Exercises General Exercises - Lower Extremity Long Arc Quad: AROM;Both;Seated;15 reps Hip Flexion/Marching: AROM;Both;Seated;15 reps    General Comments        Pertinent Vitals/Pain Pain Assessment: No/denies pain    Home Living                      Prior Function            PT Goals (current goals can now be found in the care plan section) Progress towards PT goals: Progressing toward goals    Frequency           PT Plan Current plan remains appropriate    Co-evaluation  End of Session Equipment Utilized During Treatment: Gait belt Activity Tolerance: Patient tolerated treatment well Patient left: in chair;with call bell/phone within reach     Time: 1002-1030 PT Time Calculation (min) (ACUTE ONLY): 28 min  Charges:  $Gait Training: 8-22 mins $Therapeutic Exercise: 8-22 mins                    G Codes:      Bryce Ramirez Bryce Ramirez 2016/07/02, 1:49 PM Bryce Ramirez, PT [phone]

## 2016-06-24 NOTE — Progress Notes (Signed)
Pt transferred to 2W21 with belongings. Report given to receiving RN and all questions answered. VSS during transfer.  Receiving RN at bedside.  Pt assisted to chair in new room. Family updated on patient's location.

## 2016-06-24 NOTE — Progress Notes (Signed)
Patient resting in bed, no other needs at this time. Call light within reach

## 2016-06-24 NOTE — Progress Notes (Signed)
Inpatient Rehabilitation  I spoke with the patient and his sister Sofie RowerLiesa via speaker phone to discuss pt's rehab needs.  I also have spoken with Prudencio PairMaija Prater, PT,  regarding pt's PT session a short while ago.  She states her note is not in the chart yet, however pt. ambulated 300' with min guard assistance.  Pt. functionally too high level for CIR and no longer has need for 3 hours of therapies,  and that BCBS would likely not authorize an IP Rehab admission.   Pt. and sister understand that they will be contacted for further discharge planning by the social worker. I will sign off. Please call if questions.  Weldon PickingSusan Aly Seidenberg PT Inpatient Rehab Admissions Coordinator Cell [phone] Office [phone]

## 2016-06-25 ENCOUNTER — Inpatient Hospital Stay (HOSPITAL_COMMUNITY): Payer: BLUE CROSS/BLUE SHIELD

## 2016-06-25 DIAGNOSIS — I2109 ST elevation (STEMI) myocardial infarction involving other coronary artery of anterior wall: Principal | ICD-10-CM

## 2016-06-25 LAB — GLUCOSE, CAPILLARY
GLUCOSE-CAPILLARY: 88 mg/dL (ref 65–99)
GLUCOSE-CAPILLARY: 80 mg/dL (ref 65–99)
GLUCOSE-CAPILLARY: 179 mg/dL — AB (ref 65–99)
Glucose-Capillary: 298 mg/dL — ABNORMAL HIGH (ref 65–99)

## 2016-06-25 LAB — COMPREHENSIVE METABOLIC PANEL
ALT: 39 U/L (ref 17–63)
AST: 36 U/L (ref 15–41)
Albumin: 2.6 g/dL — ABNORMAL LOW (ref 3.5–5.0)
Alkaline Phosphatase: 113 U/L (ref 38–126)
Anion gap: 9 (ref 5–15)
BUN: 30 mg/dL — ABNORMAL HIGH (ref 6–20)
CO2: 32 mmol/L (ref 22–32)
Calcium: 8.7 mg/dL — ABNORMAL LOW (ref 8.9–10.3)
Chloride: 92 mmol/L — ABNORMAL LOW (ref 101–111)
Creatinine, Ser: 0.98 mg/dL (ref 0.61–1.24)
GFR calc Af Amer: >60 mL/min (ref >60)
GFR calc non Af Amer: >60 mL/min (ref >60)
Glucose, Bld: 222 mg/dL — ABNORMAL HIGH (ref 65–99)
Potassium: 4.8 mmol/L (ref 3.5–5.1)
Sodium: 133 mmol/L — ABNORMAL LOW (ref 135–145)
Total Bilirubin: 0.3 mg/dL (ref 0.3–1.2)
Total Protein: 6.4 g/dL — ABNORMAL LOW (ref 6.5–8.1)

## 2016-06-25 LAB — CBC
HCT: 35.4 % — ABNORMAL LOW (ref 39.0–52.0)
Hemoglobin: 11.4 g/dL — ABNORMAL LOW (ref 13.0–17.0)
MCH: 28.7 pg (ref 26.0–34.0)
MCHC: 32.2 g/dL (ref 30.0–36.0)
MCV: 89.2 fL (ref 78.0–100.0)
Platelets: 448 10*3/uL — ABNORMAL HIGH (ref 150–400)
RBC: 3.97 MIL/uL — ABNORMAL LOW (ref 4.22–5.81)
RDW: 13.6 % (ref 11.5–15.5)
WBC: 15 10*3/uL — ABNORMAL HIGH (ref 4.0–10.5)

## 2016-06-25 MED ORDER — ASPIRIN 325 MG PO TBEC: 325.0000 mg | DELAYED_RELEASE_TABLET | Freq: Every day | ORAL | 0 refills | Status: DC

## 2016-06-25 MED ORDER — LISINOPRIL 5 MG PO TABS: 5.0000 mg | ORAL_TABLET | Freq: Every day | ORAL | Status: DC

## 2016-06-25 MED ORDER — OXYCODONE HCL 5 MG PO TABS: ORAL_TABLET | ORAL | 0 refills | Status: DC

## 2016-06-25 MED ORDER — INSULIN DETEMIR 100 UNIT/ML ~~LOC~~ SOLN
20.0000 [IU] | Freq: Two times a day (BID) | SUBCUTANEOUS | Status: DC
  Administered 2016-06-25 – 2016-06-27 (×4): 20 [IU] via SUBCUTANEOUS
  Filled 2016-06-25 (×6): qty 0.2

## 2016-06-25 MED ORDER — FUROSEMIDE 40 MG PO TABS
40.0000 mg | ORAL_TABLET | Freq: Every day | ORAL | Status: DC
  Administered 2016-06-25 – 2016-06-27 (×3): 40 mg via ORAL
  Filled 2016-06-25 (×4): qty 1

## 2016-06-25 MED ORDER — FENTANYL 50 MCG/HR TD PT72: 50.0000 ug | MEDICATED_PATCH | TRANSDERMAL | 0 refills | Status: DC

## 2016-06-25 MED ORDER — METOPROLOL TARTRATE 25 MG PO TABS: 12.5000 mg | ORAL_TABLET | Freq: Two times a day (BID) | ORAL | Status: DC

## 2016-06-25 MED ORDER — FUROSEMIDE 20 MG PO TABS: 20.0000 mg | ORAL_TABLET | Freq: Every day | ORAL | Status: DC

## 2016-06-25 MED ORDER — ATORVASTATIN CALCIUM 80 MG PO TABS
80.0000 mg | ORAL_TABLET | Freq: Every day | ORAL | Status: DC
  Administered 2016-06-25 – 2016-06-26 (×2): 80 mg via ORAL
  Filled 2016-06-25 (×2): qty 1

## 2016-06-25 NOTE — Progress Notes (Signed)
   Subjective:  No CP/SOB. POD #7 CABG X 3 . S/p STEMI , LM/ 3 VD with severe LVD. Looks great. Ambulating. EF 35% by 2D on 1/12  Objective:  Temp:  [97.4 F (36.3 C)-98.6 F (37 C)] 98.2 F (36.8 C) (01/16 0853) Pulse Rate:  [79-98] 98 (01/16 1034) Resp:  [12-34] 12 (01/16 0853) BP: (94-125)/(46-86) 125/69 (01/16 1034) SpO2:  [90 %-96 %] 90 % (01/16 0853) Weight:  [179 lb (81.2 kg)] 179 lb (81.2 kg) (01/16 0630) Weight change: 3.3 oz (0.094 kg)  Intake/Output from previous day: 01/15 0701 - 01/16 0700 In: 860 [P.O.:720; IV Piggyback:50] Out: 2295 [Urine:2295]  Intake/Output from this shift: Total I/O In: -  Out: 575 [Urine:575]  Physical Exam: General appearance: alert and no distress Neck: no adenopathy, no carotid bruit, no JVD, supple, symmetrical, trachea midline and thyroid not enlarged, symmetric, no tenderness/mass/nodules Lungs: clear to auscultation bilaterally Heart: regular rate and rhythm, S1, S2 normal, no murmur, click, rub or gallop Extremities: extremities normal, atraumatic, no cyanosis or edema  Lab Results: Results for orders placed or performed during the hospital encounter of 06/16/16 (from the past 48 hour(s))  Glucose, capillary     Status: Abnormal   Collection Time: 06/23/16 12:08 PM  Result Value Ref Range   Glucose-Capillary 273 (H) 65 - 99 mg/dL   Comment 1 Capillary Specimen    Comment 2 Notify RN   I-STAT, chem 8     Status: Abnormal   Collection Time: 06/23/16  4:53 PM  Result Value Ref Range   Sodium 137 135 - 145 mmol/L   Potassium 4.5 3.5 - 5.1 mmol/L   Chloride 89 (L) 101 - 111 mmol/L   BUN 25 (H) 6 - 20 mg/dL   Creatinine, Ser 1.10 0.61 - 1.24 mg/dL   Glucose, Bld 254 (H) 65 - 99 mg/dL   Calcium, Ion 1.25 1.15 - 1.40 mmol/L   TCO2 39 0 - 100 mmol/L   Hemoglobin 12.6 (L) 13.0 - 17.0 g/dL   HCT 37.0 (L) 39.0 - 52.0 %  Glucose, capillary     Status: Abnormal   Collection Time: 06/23/16  9:35 PM  Result Value Ref Range   Glucose-Capillary 169 (H) 65 - 99 mg/dL   Comment 1 Notify RN    Comment 2 Document in Chart   Cooxemetry Panel (carboxy, met, total hgb, O2 sat)     Status: None   Collection Time: 06/24/16  4:40 AM  Result Value Ref Range   Total hemoglobin 12.2 12.0 - 16.0 g/dL   O2 Saturation 61.0 %   Carboxyhemoglobin 0.7 0.5 - 1.5 %   Methemoglobin 1.2 0.0 - 1.5 %  CBC     Status: Abnormal   Collection Time: 06/24/16  4:51 AM  Result Value Ref Range   WBC 12.9 (H) 4.0 - 10.5 K/uL   RBC 4.11 (L) 4.22 - 5.81 MIL/uL   Hemoglobin 11.9 (L) 13.0 - 17.0 g/dL   HCT 36.7 (L) 39.0 - 52.0 %   MCV 89.3 78.0 - 100.0 fL   MCH 29.0 26.0 - 34.0 pg   MCHC 32.4 30.0 - 36.0 g/dL   RDW 13.4 11.5 - 15.5 %   Platelets 448 (H) 150 - 400 K/uL  Basic metabolic panel     Status: Abnormal   Collection Time: 06/24/16  4:51 AM  Result Value Ref Range   Sodium 138 135 - 145 mmol/L   Potassium 4.7 3.5 - 5.1 mmol/L   Chloride   92 (L) 101 - 111 mmol/L   CO2 36 (H) 22 - 32 mmol/L   Glucose, Bld 155 (H) 65 - 99 mg/dL   BUN 24 (H) 6 - 20 mg/dL   Creatinine, Ser 0.88 0.61 - 1.24 mg/dL   Calcium 9.0 8.9 - 10.3 mg/dL   GFR calc non Af Amer >60 >60 mL/min   GFR calc Af Amer >60 >60 mL/min    Comment: (NOTE) The eGFR has been calculated using the CKD EPI equation. This calculation has not been validated in all clinical situations. eGFR's persistently <60 mL/min signify possible Chronic Kidney Disease.    Anion gap 10 5 - 15  Glucose, capillary     Status: Abnormal   Collection Time: 06/24/16  8:22 AM  Result Value Ref Range   Glucose-Capillary 134 (H) 65 - 99 mg/dL   Comment 1 Capillary Specimen    Comment 2 Notify RN   Glucose, capillary     Status: Abnormal   Collection Time: 06/24/16 12:33 PM  Result Value Ref Range   Glucose-Capillary 216 (H) 65 - 99 mg/dL   Comment 1 Capillary Specimen    Comment 2 Notify RN   Glucose, capillary     Status: Abnormal   Collection Time: 06/24/16  5:09 PM  Result Value Ref  Range   Glucose-Capillary 124 (H) 65 - 99 mg/dL   Comment 1 Capillary Specimen    Comment 2 Notify RN   Glucose, capillary     Status: Abnormal   Collection Time: 06/24/16  9:42 PM  Result Value Ref Range   Glucose-Capillary 61 (L) 65 - 99 mg/dL  Glucose, capillary     Status: Abnormal   Collection Time: 06/24/16 10:49 PM  Result Value Ref Range   Glucose-Capillary 131 (H) 65 - 99 mg/dL  Comprehensive metabolic panel     Status: Abnormal   Collection Time: 06/25/16  4:10 AM  Result Value Ref Range   Sodium 133 (L) 135 - 145 mmol/L   Potassium 4.8 3.5 - 5.1 mmol/L   Chloride 92 (L) 101 - 111 mmol/L   CO2 32 22 - 32 mmol/L   Glucose, Bld 222 (H) 65 - 99 mg/dL   BUN 30 (H) 6 - 20 mg/dL   Creatinine, Ser 0.98 0.61 - 1.24 mg/dL   Calcium 8.7 (L) 8.9 - 10.3 mg/dL   Total Protein 6.4 (L) 6.5 - 8.1 g/dL   Albumin 2.6 (L) 3.5 - 5.0 g/dL   AST 36 15 - 41 U/L   ALT 39 17 - 63 U/L   Alkaline Phosphatase 113 38 - 126 U/L   Total Bilirubin 0.3 0.3 - 1.2 mg/dL   GFR calc non Af Amer >60 >60 mL/min   GFR calc Af Amer >60 >60 mL/min    Comment: (NOTE) The eGFR has been calculated using the CKD EPI equation. This calculation has not been validated in all clinical situations. eGFR's persistently <60 mL/min signify possible Chronic Kidney Disease.    Anion gap 9 5 - 15  CBC     Status: Abnormal   Collection Time: 06/25/16  4:10 AM  Result Value Ref Range   WBC 15.0 (H) 4.0 - 10.5 K/uL   RBC 3.97 (L) 4.22 - 5.81 MIL/uL   Hemoglobin 11.4 (L) 13.0 - 17.0 g/dL   HCT 35.4 (L) 39.0 - 52.0 %   MCV 89.2 78.0 - 100.0 fL   MCH 28.7 26.0 - 34.0 pg   MCHC 32.2 30.0 - 36.0 g/dL     RDW 13.6 11.5 - 15.5 %   Platelets 448 (H) 150 - 400 K/uL  Glucose, capillary     Status: Abnormal   Collection Time: 06/25/16  6:26 AM  Result Value Ref Range   Glucose-Capillary 298 (H) 65 - 99 mg/dL    Imaging: Imaging results have been reviewed  Tele- NSR  Assessment/Plan:   1. Active Problems: 2.   ST  elevation myocardial infarction (STEMI) (HCC) 3.   S/P CABG x 3 4.   Time Spent Directly with Patient:  25 minutes  Length of Stay:  LOS: 9 days   POD # 7 CABG X 3, Ant STEMI 1/7 with cath showing LM/ 3 VD with severe LVD. Looks great. Ambulating w/o difficulty. VSS. Exam benign. Nl progression per TCTS. Pt lives in [place] and will be returning there post D/C.   ,  06/25/2016, 10:45 AM 

## 2016-06-25 NOTE — Progress Notes (Signed)
CARDIAC REHAB PHASE I   PRE:  Rate/Rhythm: 84 SR    BP: sitting 109/63    SaO2: 91-92 RA  MODE:  Ambulation: 550 ft   POST:  Rate/Rhythm: 103 ST with PVC    BP: sitting 125/69     SaO2: 92-93 RA  Pt eager to walk. Gave him reminders of how to scoot hips without using arms. Able to don prosthesis independently and stand with min assist. Used RW, assist x2 with gait belt for security. Steady. Needed to rest x4 due to DOE/fatigue. To recliner after walk, pt diaphoretic.  Pt eager to walk later. Encouraged IS but no O2 needed.Will f/u as x1. [phone]  Harriet Massonandi Kristan Trysten Berti CES, ACSM 06/25/2016 10:11 AM

## 2016-06-25 NOTE — Discharge Instructions (Signed)
We ask the SNF to please do the following: °1. Please obtain vital signs at least one time daily °2.Please weigh the patient daily. If he or she continues to gain weight or develops lower extremity edema, contact the office at [phone]. °3. Ambulate patient at least three times daily and please use sternal precautions. ° °Coronary Artery Bypass Grafting, Care After °Refer to this sheet in the next few weeks. These instructions provide you with information on caring for yourself after your procedure. Your health care provider may also give you more specific instructions. Your treatment has been planned according to current medical practices, but problems sometimes occur. Call your health care provider if you have any problems or questions after your procedure. °WHAT TO EXPECT AFTER THE PROCEDURE °Recovery from surgery will be different for everyone. Some people feel well after 3 or 4 weeks, while for others it takes longer. After your procedure, it is typical to have the following: °· Nausea and a lack of appetite.   °· Constipation. °· Weakness and fatigue.   °· Depression or irritability.   °· Pain or discomfort at your incision site. °HOME CARE INSTRUCTIONS °· Take medicines only as directed by your health care provider. Do not stop taking medicines or start any new medicines without first checking with your health care provider. °· Take your pulse as directed by your health care provider. °· Perform deep breathing as directed by your health care provider. If you were given a device called an incentive spirometer, use it to practice deep breathing several times a day. Support your chest with a pillow or your arms when you take deep breaths or cough. °· Keep incision areas clean, dry, and protected. Remove or change any bandages (dressings) only as directed by your health care provider. You may have skin adhesive strips over the incision areas. Do not take the strips off. They will fall off on their  own. °· Check incision areas daily for any swelling, redness, or drainage. °· If incisions were made in your legs, do the following: °¨ Avoid crossing your legs.   °¨ Avoid sitting for long periods of time. Change positions every 30 minutes.   °¨ Elevate your legs when you are sitting. °· Wear compression stockings as directed by your health care provider. These stockings help keep blood clots from forming in your legs. °· Take showers once your health care provider approves. Until then, only take sponge baths. Pat incisions dry. Do not rub incisions with a washcloth or towel. Do not take baths, swim, or use a hot tub until your health care provider approves. °· Eat foods that are high in fiber, such as raw fruits and vegetables, whole grains, beans, and nuts. Meats should be lean cut. Avoid canned, processed, and fried foods. °· Drink enough fluid to keep your urine clear or pale yellow. °· Weigh yourself every day. This helps identify if you are retaining fluid that may make your heart and lungs work harder. °· Rest and limit activity as directed by your health care provider. You may be instructed to: °¨ Stop any activity at once if you have chest pain, shortness of breath, irregular heartbeats, or dizziness. Get help right away if you have any of these symptoms. °¨ Move around frequently for short periods or take short walks as directed by your health care provider. Increase your activities gradually. You may need physical therapy or cardiac rehabilitation to help strengthen your muscles and build your endurance. °¨ Avoid lifting, pushing, or pulling anything heavier   than 10 lb (4.5 kg) for at least 6 weeks after surgery. °· Do not drive until your health care provider approves.  °· Ask your health care provider when you may return to work. °· Ask your health care provider when you may resume sexual activity. °· Keep all follow-up visits as directed by your health care provider. This is important. °SEEK MEDICAL  CARE IF: °· You have swelling, redness, increasing pain, or drainage at the site of an incision. °· You have a fever. °· You have swelling in your ankles or legs. °· You have pain in your legs.   °· You gain 2 or more pounds (0.9 kg) a day. °· You are nauseous or vomit. °· You have diarrhea.  °SEEK IMMEDIATE MEDICAL CARE IF: °· You have chest pain that goes to your jaw or arms. °· You have shortness of breath.   °· You have a fast or irregular heartbeat.   °· You notice a "clicking" in your breastbone (sternum) when you move.   °· You have numbness or weakness in your arms or legs. °· You feel dizzy or light-headed.   °MAKE SURE YOU: °· Understand these instructions. °· Will watch your condition. °· Will get help right away if you are not doing well or get worse. °This information is not intended to replace advice given to you by your health care provider. Make sure you discuss any questions you have with your health care provider. °Document Released: 12/14/2004 Document Revised: 06/17/2014 Document Reviewed: 11/03/2012 °Elsevier Interactive Patient Education © 2017 Elsevier Inc. ° °

## 2016-06-25 NOTE — Progress Notes (Signed)
Sutures removed per order. Benzoin and steri strips applied to sites. Epicardial pacing wires also removed per order. Wire ends intact upon removal. Pt tolerated well. Vitals are being monitored every 15 minutes per protocol. Pt on bed rest for 1 hour. Call bell within reach. Will continue to monitor pt.  Berdine DanceLauren Moffitt BSN, RN

## 2016-06-25 NOTE — Progress Notes (Addendum)
      [address]       [place] [id number]             [phone]        9 Days Post-Op Procedure(s) (LRB): CORONARY ARTERY BYPASS GRAFTING (CABG) x 3 (N/A) ENDOVEIN HARVEST OF GREATER SAPHENOUS VEIN (Left)  Subjective: Patient without specific complaints this am.  Objective: Vital signs in last 24 hours: Temp:  [97.4 F (36.3 C)-98.9 F (37.2 C)] 98.2 F (36.8 C) (01/16 0630) Pulse Rate:  [79-91] 80 (01/16 0630) Cardiac Rhythm: Normal sinus rhythm (01/15 1900) Resp:  [13-34] 18 (01/16 0630) BP: (94-123)/(46-86) 118/56 (01/16 0630) SpO2:  [90 %-98 %] 90 % (01/16 0630) Weight:  [179 lb (81.2 kg)] 179 lb (81.2 kg) (01/16 0630)  Pre op weight 83.9 kg Current Weight  06/25/16 179 lb (81.2 kg)      Intake/Output from previous day 01/15 0701 - 01/16 0700 In: 860 [P.O.:720; IV Piggyback:50] Out: 2295 [Urine:2295]   Physical Exam:  Cardiovascular: RRR Pulmonary: Slightly diminished at bases Abdomen: Soft, non tender, bowel sounds present. Extremities: No lower left extremity edema. Wounds: Clean and dry.  No erythema or signs of infection.  Lab Results: CBC: Recent Labs  06/24/16 0451 06/25/16 0410  WBC 12.9* 15.0*  HGB 11.9* 11.4*  HCT 36.7* 35.4*  PLT 448* 448*   BMET:  Recent Labs  06/24/16 0451 06/25/16 0410  NA 138 133*  K 4.7 4.8  CL 92* 92*  CO2 36* 32  GLUCOSE 155* 222*  BUN 24* 30*  CREATININE 0.88 0.98  CALCIUM 9.0 8.7*    PT/INR:  Lab Results  Component Value Date   INR 1.20 06/19/2016   INR 1.25 06/17/2016   INR 0.94 06/16/2016   ABG:  INR: Will add last result for INR, ABG once components are confirmed Will add last 4 CBG results once components are confirmed  Assessment/Plan:  1. CV - SR in the 80's. On Lopressor 12.5 mg bid and Lisinopril 5 mg daily. He was on Plavix pre op and per Dr. Donata ClayVan Trigt NO need to restart. 2.  Pulmonary - On room air. CXR this am appears to show bibasilar atelectasis, no  pneumothorax. Encourage incentive spirometer. 3. Volume Overload - On Lasix IV daily. Will change to oral. 4.  Acute blood loss anemia - H and H stable at 11.4 and 35.4 5. DM-CBGs 61/131/298. On Insulin.  6. Right BKA-continue with PT 7. ID-on Elita QuickFortaz (likely pulmonary etiology). Will stop after today.Per Dr. Donata ClayVan Trigt, no need for oral antibiotic. 8. Remove EPW 9. Will restart Lipitor-LFTs this am "WNL" 10. To SNF when bed available  Adriane Guglielmo MPA-C 06/25/2016,7:46 AM

## 2016-06-25 NOTE — Progress Notes (Signed)
CSW manually faxed out information to patients first choice of SNF ([facility]). CSW is waiting a call from admissions coordinator to discuss patient admissions. CSW remains available for support and discharge needs.  Marrianne MoodAshley Graylee Arutyunyan, MSW,  [employer]CSWA [phone]

## 2016-06-25 NOTE — Progress Notes (Signed)
Patient sitting up in bed eating his dinner. No needs at this time, call light within reach

## 2016-06-25 NOTE — Progress Notes (Signed)
Inpatient Diabetes Program Recommendations  AACE/ADA: New Consensus Statement on Inpatient Glycemic Control (2015)  Target Ranges:  Prepandial:   less than 140 mg/dL      Peak postprandial:   less than 180 mg/dL (1-2 hours)      Critically ill patients:  140 - 180 mg/dL   Lab Results  Component Value Date   GLUCAP 88 06/25/2016    Review of Glycemic Control:  Results for [name], Eswin (MRN [id number]) as of 06/25/2016 13:52  Ref. Range 06/24/2016 17:09 06/24/2016 21:42 06/24/2016 22:49 06/25/2016 06:26 06/25/2016 11:27  Glucose-Capillary Latest Ref Range: 65 - 99 mg/dL 119124 (H) 61 (L) 147131 (H) 298 (H) 88   Inpatient Diabetes Program Recommendations:    Consider slight decrease in Novolog meal coverage to 5 units tid with meals.  Thanks, Beryl MeagerJenny Wallice Granville, RN, BC-ADM Inpatient Diabetes Coordinator Pager [phone] (8a-5p)

## 2016-06-26 DIAGNOSIS — I2102 ST elevation (STEMI) myocardial infarction involving left anterior descending coronary artery: Secondary | ICD-10-CM

## 2016-06-26 LAB — GLUCOSE, CAPILLARY
GLUCOSE-CAPILLARY: 132 mg/dL — AB (ref 65–99)
GLUCOSE-CAPILLARY: 58 mg/dL — AB (ref 65–99)
Glucose-Capillary: 150 mg/dL — ABNORMAL HIGH (ref 65–99)
Glucose-Capillary: 219 mg/dL — ABNORMAL HIGH (ref 65–99)
Glucose-Capillary: 78 mg/dL (ref 65–99)

## 2016-06-26 MED ORDER — INSULIN ASPART 100 UNIT/ML ~~LOC~~ SOLN: 6.0000 [IU] | Freq: Three times a day (TID) | SUBCUTANEOUS | 11 refills | Status: DC

## 2016-06-26 MED ORDER — INSULIN DETEMIR 100 UNIT/ML ~~LOC~~ SOLN: 20.0000 [IU] | Freq: Two times a day (BID) | SUBCUTANEOUS | 11 refills | Status: DC

## 2016-06-26 NOTE — Progress Notes (Signed)
CSW started insurance authorization process for patient to go to [facility]. Will check back in tomorrow to see if patient has been approved.  Osborne Cascoadia Lelend Heinecke LCSWA [phone]

## 2016-06-26 NOTE — Progress Notes (Signed)
Physical Therapy Treatment Patient Details Name: Bryce Ramirez MRN: [id number] DOB: 09/09/1955 Today's Date: 06/26/2016    History of Present Illness 61 yo with STEMI s/p emergent CABGx3. PMhx: Rt BKA, DM, HTN, CAD    PT Comments    Pt ambulated 500' with min-guard A and RW, required several standing rest breaks. Able to verbalize sternal precautions but still needs cues to actually keep them with mobility. Continue to recommend SNF at d/c as pt does not have any supervision at home.    Follow Up Recommendations  SNF;Supervision/Assistance - 24 hour     Equipment Recommendations  Rolling walker with 5" wheels    Recommendations for Other Services OT consult     Precautions / Restrictions Precautions Precautions: Fall;Sternal Precaution Comments: Rt BKA Restrictions Weight Bearing Restrictions: No    Mobility  Bed Mobility Overal bed mobility: Modified Independent Bed Mobility: Supine to Sit     Supine to sit: Modified independent (Device/Increase time)     General bed mobility comments: reminder to roll rather than sitting straight up and pt performed without assist  Transfers Overall transfer level: Needs assistance Equipment used: Rolling walker (2 wheeled) Transfers: Sit to/from Stand Sit to Stand: Min guard         General transfer comment: cues for not pushing down on RW with sit to stand  Ambulation/Gait Ambulation/Gait assistance: Min guard Ambulation Distance (Feet): 500 Feet Assistive device: Rolling walker (2 wheeled) Gait Pattern/deviations: Step-through pattern;Decreased stance time - right Gait velocity: decreased Gait velocity interpretation: Below normal speed for age/gender General Gait Details: vc's for foward gaze. Pt with increased toe out on right compared to left, able to correct to a certain extent but reports that it will be corrected more with new prosthesis, kicked the walker once because of this. Practiced bkwd walking with RW and min  guard A   Stairs            Wheelchair Mobility    Modified Rankin (Stroke Patients Only)       Balance Overall balance assessment: Needs assistance Sitting-balance support: No upper extremity supported Sitting balance-Leahy Scale: Good       Standing balance-Leahy Scale: Fair                      Cognition Arousal/Alertness: Awake/alert Behavior During Therapy: WFL for tasks assessed/performed Overall Cognitive Status: Within Functional Limits for tasks assessed                 General Comments: pt able to verbalize sternal precautions but needs vc's for keeping them with functional mobility    Exercises      General Comments General comments (skin integrity, edema, etc.): HR 84-94 bpm      Pertinent Vitals/Pain Pain Assessment: No/denies pain    Home Living                      Prior Function            PT Goals (current goals can now be found in the care plan section) Acute Rehab PT Goals Patient Stated Goal: return home PT Goal Formulation: With patient Time For Goal Achievement: 07/04/16 Potential to Achieve Goals: Good Progress towards PT goals: Progressing toward goals    Frequency    Min 3X/week      PT Plan Current plan remains appropriate    Co-evaluation             End of Session  Equipment Utilized During Treatment: Gait belt Activity Tolerance: Patient tolerated treatment well Patient left: in chair;with call bell/phone within reach     Time: 1459-1532 PT Time Calculation (min) (ACUTE ONLY): 33 min  Charges:  $Gait Training: 23-37 mins                    G Codes:     Lyanne Co, PT  Acute Rehab Services  [phone]  Lawana Chambers Joia Doyle 06/26/2016, 4:20 PM

## 2016-06-26 NOTE — Progress Notes (Addendum)
      [address]       [place] [id number]             [phone]        10 Days Post-Op Procedure(s) (LRB): CORONARY ARTERY BYPASS GRAFTING (CABG) x 3 (N/A) ENDOVEIN HARVEST OF GREATER SAPHENOUS VEIN (Left)  Subjective: Patient without specific complaints this am. He states he feels fairly good. Objective: Vital signs in last 24 hours: Temp:  [97.8 F (36.6 C)-98.4 F (36.9 C)] 98 F (36.7 C) (01/17 0448) Pulse Rate:  [76-98] 76 (01/17 0448) Cardiac Rhythm: Normal sinus rhythm (01/16 1900) Resp:  [12-18] 18 (01/17 0448) BP: (99-142)/(54-69) 114/59 (01/17 0448) SpO2:  [90 %-92 %] 92 % (01/17 0448) Weight:  [178 lb 3.2 oz (80.8 kg)] 178 lb 3.2 oz (80.8 kg) (01/17 0448)  Pre op weight 83.9 kg Current Weight  06/26/16 178 lb 3.2 oz (80.8 kg)      Intake/Output from previous day 01/16 0701 - 01/17 0700 In: 70 [I.V.:20; IV Piggyback:50] Out: 1555 [Urine:1555]   Physical Exam:  Cardiovascular: RRR Pulmonary: Mostly clear Abdomen: Soft, non tender, bowel sounds present. Extremities: No left lower  extremity edema. Wounds: Clean and dry.  No erythema or signs of infection.  Lab Results: CBC:  Recent Labs  06/24/16 0451 06/25/16 0410  WBC 12.9* 15.0*  HGB 11.9* 11.4*  HCT 36.7* 35.4*  PLT 448* 448*   BMET:   Recent Labs  06/24/16 0451 06/25/16 0410  NA 138 133*  K 4.7 4.8  CL 92* 92*  CO2 36* 32  GLUCOSE 155* 222*  BUN 24* 30*  CREATININE 0.88 0.98  CALCIUM 9.0 8.7*    PT/INR:  Lab Results  Component Value Date   INR 1.20 06/19/2016   INR 1.25 06/17/2016   INR 0.94 06/16/2016   ABG:  INR: Will add last result for INR, ABG once components are confirmed Will add last 4 CBG results once components are confirmed  Assessment/Plan:  1. CV - SR in the 80's. On Lopressor 12.5 mg bid and Lisinopril 5 mg daily. He was on Plavix pre op and per Dr. Donata ClayVan Trigt NO need to restart. 2.  Pulmonary - On room air.  Encourage incentive  spirometer. 3. Volume Overload - On Lasix IV daily. Will change to oral. 4.  Acute blood loss anemia - H and H stable at 11.4 and 35.4 5. DM-CBGs 80/179/219. On Insulin.  6. Right BKA-continue with PT 7. To SNF when bed available  ZIMMERMAN,DONIELLE MPA-C 06/26/2016,7:16 AM  ready for SNF No need for plavix - all stents stenotic- occluded patient examined and medical record reviewed,agree with above note. Kathlee Nationseter Van Trigt III 06/26/2016

## 2016-06-26 NOTE — Progress Notes (Signed)
CARDIAC REHAB PHASE I   PRE:  Rate/Rhythm: 77 SR BP:  Sitting: 115/63        SaO2: 91 RA  MODE:  Ambulation: 350 ft   POST:  Rate/Rhythm: 94 SR c/ occ PVC  BP:  Sitting: 136/65         SaO2: 95 RA  Pt ambulated 350 ft on RA, rolling walker, IV, prosthesis, gait belt, assist x1, steady gait, tolerated well. Pt c/o mild dizziness, mild DOE, denies any other complaints, declined rest stop. Cardiac surgery discharge education completed with pt. Reviewed IS, sternal precautions, activity progression, exercise guidelines, heart healthy and diabetes diet handouts, daily weights and phase 2 cardiac rehab. Pt verbalized understanding, receptive to education. Pt agrees to phase 2 cardiac rehab referral, will [facility] [place] per pt request. Encouraged additional ambulation x2 today. Pt to recliner after walk, call bell within reach. Will follow up tomorrow if pt does not discharge today.  [phone] Joylene GrapesEmily C Muaz Shorey, RN, BSN 06/26/2016 9:37 AM

## 2016-06-27 LAB — GLUCOSE, CAPILLARY
GLUCOSE-CAPILLARY: 193 mg/dL — AB (ref 65–99)
GLUCOSE-CAPILLARY: 399 mg/dL — AB (ref 65–99)
GLUCOSE-CAPILLARY: 332 mg/dL — AB (ref 65–99)

## 2016-06-27 MED ORDER — LISINOPRIL 2.5 MG PO TABS: 2.5000 mg | ORAL_TABLET | Freq: Every day | ORAL | Status: DC

## 2016-06-27 MED ORDER — INSULIN ASPART 100 UNIT/ML ~~LOC~~ SOLN
4.0000 [IU] | Freq: Three times a day (TID) | SUBCUTANEOUS | Status: DC
  Administered 2016-06-27: 4 [IU] via SUBCUTANEOUS

## 2016-06-27 MED ORDER — LISINOPRIL 2.5 MG PO TABS
2.5000 mg | ORAL_TABLET | Freq: Every day | ORAL | Status: DC
  Filled 2016-06-27: qty 1

## 2016-06-27 MED ORDER — INSULIN ASPART 100 UNIT/ML ~~LOC~~ SOLN: 4.0000 [IU] | Freq: Three times a day (TID) | SUBCUTANEOUS | 11 refills | Status: AC

## 2016-06-27 NOTE — Progress Notes (Addendum)
      [address]       [place] [id number]             [phone]        11 Days Post-Op Procedure(s) (LRB): CORONARY ARTERY BYPASS GRAFTING (CABG) x 3 (N/A) ENDOVEIN HARVEST OF GREATER SAPHENOUS VEIN (Left)  Subjective: Patient without specific complaints this am.   Objective: Vital signs in last 24 hours: Temp:  [97.5 F (36.4 C)-98 F (36.7 C)] 98 F (36.7 C) (01/18 0503) Pulse Rate:  [74-79] 77 (01/18 0503) Cardiac Rhythm: Normal sinus rhythm (01/17 1900) Resp:  [16-20] 16 (01/18 0503) BP: (97-120)/(53-64) 104/64 (01/18 0503) SpO2:  [91 %-97 %] 91 % (01/18 0503) Weight:  [179 lb 1.6 oz (81.2 kg)] 179 lb 1.6 oz (81.2 kg) (01/18 0503)  Pre op weight 83.9 kg Current Weight  06/27/16 179 lb 1.6 oz (81.2 kg)      Intake/Output from previous day 01/17 0701 - 01/18 0700 In: 650 [P.O.:600; IV Piggyback:50] Out: 1410 [Urine:1410]   Physical Exam:  Cardiovascular: RRR Pulmonary: Mostly clear Abdomen: Soft, non tender, bowel sounds present. Extremities: No left lower  extremity edema. Wounds: Clean and dry.  No erythema or signs of infection.  Lab Results: CBC:  Recent Labs  06/25/16 0410  WBC 15.0*  HGB 11.4*  HCT 35.4*  PLT 448*   BMET:   Recent Labs  06/25/16 0410  NA 133*  K 4.8  CL 92*  CO2 32  GLUCOSE 222*  BUN 30*  CREATININE 0.98  CALCIUM 8.7*    PT/INR:  Lab Results  Component Value Date   INR 1.20 06/19/2016   INR 1.25 06/17/2016   INR 0.94 06/16/2016   ABG:  INR: Will add last result for INR, ABG once components are confirmed Will add last 4 CBG results once components are confirmed  Assessment/Plan:  1. CV - SR in the 70's. On Lopressor 12.5 mg bid and Lisinopril 5 mg daily.  2.  Pulmonary - On room air.  Encourage incentive spirometer. 3. Volume Overload - On Lasix PO daily.  4.  Acute blood loss anemia - H and H stable at 11.4 and 35.4 5. DM-CBGs 58/78/193. On Insulin.  6. Right BKA-continue with  PT 7. To SNF when insurance approves and  bed available  ZIMMERMAN,DONIELLE MPA-C 06/27/2016,7:20 AM Patient examined, agree with above assessment and plan Surgical incisions clean and dry Maintaining sinus rhythm Diabetic control adequate on current regimen Waiting for skilled nursing facility placement

## 2016-06-27 NOTE — Progress Notes (Signed)
Patient is medically stable for discharge to SNF. [facility] has been accepted to : [facility]. Insurance authorization has been obtained.  Patient will transport by EMS. Patient to notify his family of discharge.  No other barriers.  RN to call report:  [phone]  Deretha EmoryHannah Davidlee Jeanbaptiste LCSW, MSW Clinical Social Work: System Wide Float Coverage for :  [phone]

## 2016-06-27 NOTE — Progress Notes (Signed)
Called PA Doree Fudgeonielle Zimmerman to make aware that patient actually had an insulin pump packed in his belongings.  Patient is aware that he will resume his insulin pump when he returns home to [place].  Patient encouraged to follow carb modified/ heart healthy diet. Rechecked blood glucose prior to discharge.  Patient is aware he will get another dose of long acting insulin this evening. Thomas HoffBurton, Rhett Mutschler McClintock, RN

## 2016-06-27 NOTE — Progress Notes (Signed)
Inpatient Diabetes Program Recommendations  AACE/ADA: New Consensus Statement on Inpatient Glycemic Control (2015)  Target Ranges:  Prepandial:   less than 140 mg/dL      Peak postprandial:   less than 180 mg/dL (1-2 hours)      Critically ill patients:  140 - 180 mg/dL   Lab Results  Component Value Date   GLUCAP 193 (H) 06/27/2016    Review of Glycemic Control:  Results for Bryce Ramirez, Bryce Ramirez (MRN [id number]) as of 06/27/2016 10:08  Ref. Range 06/26/2016 05:58 06/26/2016 11:16 06/26/2016 16:16 06/26/2016 22:16 06/26/2016 23:50 06/27/2016 06:34  Glucose-Capillary Latest Ref Range: 65 - 99 mg/dL 130219 (H) 865150 (H) 784132 (H) 58 (L) 78 193 (H)    Diabetes history: Type 1 diabetes  Inpatient Diabetes Program Recommendations:    May consider reduction of Novolog meal coverage to 4 units with supper due to hypoglycemia last evening.  Thanks, Beryl MeagerJenny Bailee Metter, RN, BC-ADM Inpatient Diabetes Coordinator Pager [phone] (8a-5p)

## 2016-06-27 NOTE — Clinical Social Work Placement (Signed)
   CLINICAL SOCIAL WORK PLACEMENT  NOTE  Date:  06/27/2016  Patient Details  Name: Bryce Ramirez MRN: [id number] Date of Birth: 09/12/1955  Clinical Social Work is seeking post-discharge placement for this patient at the Skilled  Nursing Facility level of care (*CSW will initial, date and re-position this form in  chart as items are completed):  Yes   Patient/family provided with [facility] Clinical Social Work Department's list of facilities offering this level of care within the geographic area requested by the patient (or if unable, by the patient's family).  Yes   Patient/family informed of their freedom to choose among providers that offer the needed level of care, that participate in Medicare, Medicaid or managed care program needed by the patient, have an available bed and are willing to accept the patient.  Yes   Patient/family informed of [facility]'s ownership interest in [facility] and [facility], as well as of the fact that they are under no obligation to receive care at these facilities.  PASRR submitted to EDS on     06/27/2016   PASRR number received on      06/27/2016   Existing PASRR number confirmed on       FL2 transmitted to all facilities in geographic area requested by pt/family on      06/27/2016   FL2 transmitted to all facilities within larger geographic area on       Patient informed that his/her managed care company has contracts with or will negotiate with certain facilities, including the following:            Patient/family informed of bed offers received.  06/27/2016   Patient chooses bed at      [facility]  Physician recommends and patient chooses bed at      SNF Patient to be transferred to   on  .  06/27/2016   Patient to be transferred to facility by     EMS  Patient family notified on   of transfer.  Patient to notify family  Name of family member notified:        PHYSICIAN Please sign FL2     Additional  Comment:    _______________________________________________ Raye Sorrowoble, Dyer Klug N, LCSW 06/27/2016, 11:09 AM

## 2016-06-27 NOTE — Progress Notes (Signed)
Facility given discharge instructions, medication lists, follow up appointments, and when to call the doctor.  RN verbalizes understanding. [facility] and all questions answered.  Picked up patient valuables in security and all items accounted for by patient.  [facility] Thomas HoffBurton, Yanissa Michalsky [place], RN

## 2016-06-27 NOTE — Care Management Note (Signed)
Case Management Note Previous CM note initiated by Magdalene RiverMayo, Henrietta T, RN 06/21/2016, 12:49 PM    Patient Details  Name: Bryce Ramirez MRN: [id number] Date of Birth: 09/28/1955  Subjective/Objective:    Pt lives alone, and per his sister, there will be no one available to provide 24/7 assistance after he completes rehab.  Discussed options with pt and sister and sister feels [place] if pt needs much assistance post-rehab stay, pt is agreeable to either option.  Inpatient Rehab Admissions Coordinator will also discuss options with pt and sister.               Action/Plan: Pt s/p CABGx3-  Plan for SNF- CSW following- pt tx from ICU to 2W on 06/24/16  Expected Discharge Date:  06/27/16               Expected Discharge Plan:  Skilled Nursing Facility  In-House Referral:  Clinical Social Work  Discharge planning Services  CM Consult  Post Acute Care Choice:    Choice offered to:     DME Arranged:   NA DME Agency:     HH Arranged:   NA HH Agency:     Status of Service:  Completed, signed off  If discussed at [employer] Length of Stay Meetings, dates discussed:  1/16  Discharge Disposition: skilled facility   Additional Comments:  06/27/16- 1100- Tola Meas RN, CM- [facility] - for ETOH abuse treatment- will need ST[facility] rehab prior to returning home or [facility]-- CSW following for placement needs- has insurance auth for d/c today- Per CSW [facility].   Zenda AlpersWebster, StarkKristi Hall, RN 06/27/2016, 11:04 AM [phone]

## 2016-06-27 NOTE — Progress Notes (Signed)
CARDIAC REHAB PHASE I   PRE:  Rate/Rhythm: 83 SR  BP:  Sitting: 116/62        SaO2: 95 RA  MODE:  Ambulation: 490 ft   POST:  Rate/Rhythm: 97 SR  BP:  Sitting: 134/59         SaO2: 94 RA  Pt ambulated 490 ft on RA, rolling walker, IV, hand held assist, steady gait, tolerated well with no complaints. Pt appreciative of walk. Encouraged additional ambulation today. Pt states he has no questions regarding education at this time. Pt to recliner after walk, call bell within reach.   [phone] Joylene GrapesEmily C Jamai Dolce, RN, BSN 06/27/2016 10:52 AM

## 2016-07-03 ENCOUNTER — Ambulatory Visit: Payer: BLUE CROSS/BLUE SHIELD | Admitting: Cardiothoracic Surgery

## 2016-07-04 ENCOUNTER — Other Ambulatory Visit: Payer: Self-pay | Admitting: Cardiothoracic Surgery

## 2016-07-04 DIAGNOSIS — Z951 Presence of aortocoronary bypass graft: Secondary | ICD-10-CM

## 2016-07-05 ENCOUNTER — Ambulatory Visit: Payer: Self-pay

## 2016-07-09 ENCOUNTER — Ambulatory Visit
Admission: RE | Admit: 2016-07-09 | Discharge: 2016-07-09 | Disposition: A | Discharge status: Home / Self Care | Payer: BLUE CROSS/BLUE SHIELD | Source: Ambulatory Visit | Attending: Cardiothoracic Surgery | Admitting: Cardiothoracic Surgery

## 2016-07-09 ENCOUNTER — Ambulatory Visit (INDEPENDENT_AMBULATORY_CARE_PROVIDER_SITE_OTHER): Payer: Self-pay | Admitting: Physician Assistant

## 2016-07-09 VITALS — BP 96/61 | HR 93 | Resp 16 | Ht 70.0 in | Wt 185.0 lb

## 2016-07-09 DIAGNOSIS — Z951 Presence of aortocoronary bypass graft: Secondary | ICD-10-CM

## 2016-07-09 DIAGNOSIS — I2510 Atherosclerotic heart disease of native coronary artery without angina pectoris: Secondary | ICD-10-CM

## 2016-07-09 NOTE — Patient Instructions (Signed)
You may return to driving an automobile as long as you are no longer requiring oral narcotic pain relievers during the daytime.  It would be wise to start driving only short distances during the daylight and gradually increase from there as you feel comfortable.  Make every effort to maintain a "heart-healthy" lifestyle with regular physical exercise and adherence to a low-fat, low-carbohydrate diet.  Continue to seek regular follow-up appointments with your primary care physician and/or cardiologist.  You may gradually increase your physical activity as tolerated without any particular limitations at this time other than discussed sternal precautions

## 2016-07-09 NOTE — Progress Notes (Signed)
HPI:  Patient returns for routine postoperative follow-up having undergone Emergent CABG x 3 on 06/16/2016. The patient's early postoperative recovery while in the hospital was notable for pneumonia treated with IV ABX and Deconditioning requiring SNF placement.  Since hospital discharge the patient reports he is doing good.  He states he was released from the SNF about a week ago.  Since that time he states he continues to ambulate without much difficulty.  He does continue to have mild sternal discomfort at times.  Diet and bowels have regulated.  He has not yet followed up with Cardiology   Current Outpatient Prescriptions  Medication Sig Dispense Refill  . aspirin EC 325 MG EC tablet Take 1 tablet (325 mg total) by mouth daily. 30 tablet 0  . atorvastatin (LIPITOR) 80 MG tablet Take 80 mg by mouth at bedtime.    Marland Kitchen buPROPion (WELLBUTRIN SR) 100 MG 12 hr tablet Take 100 mg by mouth daily.    . DULoxetine (CYMBALTA) 30 MG capsule Take 90 mg by mouth daily.    Marland Kitchen gabapentin (NEURONTIN) 300 MG capsule Take 300 mg by mouth 2 (two) times daily.    . insulin aspart (NOVOLOG) 100 UNIT/ML injection Inject 4 Units into the skin 3 (three) times daily with meals. 10 mL 11  . Omega-3 Fatty Acids (FISH OIL) 1000 MG CAPS Take 2,000 mg by mouth daily.    Marland Kitchen oxyCODONE (OXY IR/ROXICODONE) 5 MG immediate release tablet Take one table by mouth every 4-6 hours PRN severe pain. 30 tablet 0  . diazepam (VALIUM) 5 MG/ML solution Take 5 mg by mouth once as needed (seizures).    . fluticasone (FLONASE) 50 MCG/ACT nasal spray Place 1 spray into both nostrils See admin instructions. Ordered 06/12/16: instill 1 spray into each nostril daily as needed for congestion    . furosemide (LASIX) 20 MG tablet Take 1 tablet (20 mg total) by mouth daily. For 5 days then stop. (Patient not taking: Reported on 07/09/2016)    . guaiFENesin (MUCINEX) 600 MG 12 hr tablet Take 600 mg by mouth 2 (two) times daily as needed (congestion). 9am, 5pm     . insulin detemir (LEVEMIR) 100 UNIT/ML injection Inject 0.2 mLs (20 Units total) into the skin 2 (two) times daily. (Patient not taking: Reported on 07/09/2016) 10 mL 11  . Multiple Vitamin-Folic Acid TABS Take 1 tablet by mouth daily.    Marland Kitchen thiamine 100 MG tablet Take 100 mg by mouth daily.    . traZODone (DESYREL) 50 MG tablet Take 50 mg by mouth at bedtime.     No current facility-administered medications for this visit.     Physical Exam:  BP 96/61 (BP Location: Right Arm, Patient Position: Sitting, Cuff Size: Large)   Pulse 93   Resp 16   Ht 5\' 10"  (1.778 m)   Wt 185 lb (83.9 kg)   SpO2 96% Comment: ON RA  BMI 26.54 kg/m   Gen: no apparent distress Heart: RRR Lung: CTA Abd: soft non-tender, non-distended Ext: no edema present Incisions: clean and dry  Diagnostic Tests:  CXR: no pleural effusions, atelectasis present  A/P:  1. S/P Emergent CABG x 3- doing well.  He did not fill his Lisinopril or Lopressor on discharge from SNF.  He was taking Coreg prior to surgery and asks if he could resume this instead... However patient's BP on exam today is 96/61... This is too low to take a BB or ACE.  I instructed patient to hold coreg  for now and possibly restart it at a later date if BP improves with follow up with Cardiologist 2. Dispo- patient looks great, ambulating without difficulty, incisions are well healed, no LE edema present.  Patient given instruction on further recovery instructions.  RTC in 8 weeks with PVT   Lowella DandyBARRETT, Remas Sobel, PA-C [facility] [phone]

## 2016-07-11 ENCOUNTER — Encounter: Payer: BLUE CROSS/BLUE SHIELD | Admitting: Cardiology

## 2016-07-24 ENCOUNTER — Ambulatory Visit: Payer: BLUE CROSS/BLUE SHIELD | Admitting: Cardiothoracic Surgery

## 2016-09-04 ENCOUNTER — Ambulatory Visit: Payer: BLUE CROSS/BLUE SHIELD | Admitting: Cardiothoracic Surgery

## 2016-09-25 ENCOUNTER — Ambulatory Visit (INDEPENDENT_AMBULATORY_CARE_PROVIDER_SITE_OTHER): Payer: BLUE CROSS/BLUE SHIELD | Admitting: Cardiothoracic Surgery

## 2016-09-25 ENCOUNTER — Encounter: Payer: Self-pay | Admitting: Cardiothoracic Surgery

## 2016-09-25 VITALS — BP 101/64 | HR 83 | Resp 20 | Ht 70.0 in | Wt 185.0 lb

## 2016-09-25 DIAGNOSIS — Z951 Presence of aortocoronary bypass graft: Secondary | ICD-10-CM

## 2016-09-25 DIAGNOSIS — I2510 Atherosclerotic heart disease of native coronary artery without angina pectoris: Secondary | ICD-10-CM | POA: Diagnosis not present

## 2016-09-25 NOTE — Progress Notes (Signed)
PCP is PROVIDER NOT IN SYSTEM Referring Provider is Kathleene Hazel*  Chief Complaint  Patient presents with  . Routine Post Op    2 month f/u, HX of CABG, scheduled to see new Cardiologist in [place] tomorrow- Dr Cecile Hearing with [facility]    HPI: Final postop visit  3 months after emergency CABG for acute MI with preoperative balloon pump for cardiogenic shock. Patient now stable back at home in [place] Patient is currently being followed by Dr. Cecile Hearing at [facility] He has a prosthetic right leg and is now ambulating well No recurrent angina or symptoms of CHF. His ejection fraction improved to 30-40 percent post CABG He is in sinus rhythm. He uses an insulin pump and diabetes is well-controlled  Past Medical History:  Diagnosis Date  . CAD (coronary artery disease)    3 stents placed in ? vessels in [place], [place] in 2006  . Diabetes mellitus without complication (HCC)   . Hypertension     Past Surgical History:  Procedure Laterality Date  . BELOW KNEE LEG AMPUTATION Right   . CARDIAC CATHETERIZATION N/A 06/16/2016   Procedure: Left Heart Cath and Coronary Angiography;  Surgeon: Kathleene Hazel, MD;  Location: [facility] INVASIVE CV LAB;  Service: Cardiovascular;  Laterality: N/A;  . CARDIAC CATHETERIZATION N/A 06/16/2016   Procedure: IABP Insertion;  Surgeon: Kathleene Hazel, MD;  Location: [facility] INVASIVE CV LAB;  Service: Cardiovascular;  Laterality: N/A;  . CARPAL TUNNEL RELEASE    . CORONARY ARTERY BYPASS GRAFT N/A 06/16/2016   Procedure: CORONARY ARTERY BYPASS GRAFTING (CABG) x 3;  Surgeon: Kerin Perna, MD;  Location: [facility] OR;  Service: Open Heart Surgery;  Laterality: N/A;  . ENDOVEIN HARVEST OF GREATER SAPHENOUS VEIN Left 06/16/2016   Procedure: ENDOVEIN HARVEST OF GREATER SAPHENOUS VEIN;  Surgeon: Kerin Perna, MD;  Location: [facility] OR;  Service: Open Heart Surgery;  Laterality: Left;  . TOE AMPUTATION Left     Family History   Problem Relation Age of Onset  . CAD Father     Social History Social History  Substance Use Topics  . Smoking status: Former Games developer  . Smokeless tobacco: Former Neurosurgeon  . Alcohol use Yes     Comment: social    Current Outpatient Prescriptions  Medication Sig Dispense Refill  . aspirin EC 81 MG tablet Take 81 mg by mouth 2 (two) times daily.    Marland Kitchen atorvastatin (LIPITOR) 80 MG tablet Take 80 mg by mouth at bedtime.    Marland Kitchen buPROPion (WELLBUTRIN SR) 100 MG 12 hr tablet Take 100 mg by mouth daily.    . DULoxetine (CYMBALTA) 30 MG capsule Take 90 mg by mouth daily.    Marland Kitchen gabapentin (NEURONTIN) 300 MG capsule Take 300 mg by mouth 2 (two) times daily.    . insulin aspart (NOVOLOG) 100 UNIT/ML injection Inject 4 Units into the skin 3 (three) times daily with meals. (Patient taking differently: Inject 4 Units into the skin. Uses with insulin pump) 10 mL 11  . losartan (COZAAR) 50 MG tablet Take 50 mg by mouth daily.     No current facility-administered medications for this visit.     No Known Allergies  Review of Systems  Stable weight No fever Surgical incisions well-healed Last chest x-ray without pleural effusion  BP 101/64   Pulse 83   Resp 20   Ht  (1.778 m)   Wt 185 lb (83.9 kg)   SpO2 96%  Comment: RA  BMI 26.54 kg/m  Physical Exam      Exam    General- alert and comfortable   Lungs- clear without rales, wheezes   Cor- regular rate and rhythm, no murmur , gallop   Abdomen- soft, non-tender   Extremities - warm, non-tender, minimal edema   Neuro- oriented, appropriate, no focal weakness   Diagnostic Tests: None  Impression: Continue current medications including losartan, carvedilol, Lipitor, 81 mg aspirin. He has no further sternal precautions now 3 months postop CABG Plan: He will return as needed.  Mikey Bussing, MD [facility] [phone]

## 2018-07-27 IMAGING — CR DG CHEST 1V PORT
1 series · 1 of 1 positions shown · non-contrast
Comparison: 06/16/2016

CLINICAL DATA: Postoperative CABG tonight.  OG placement.

EXAM:
PORTABLE CHEST 1 VIEW

[AP]
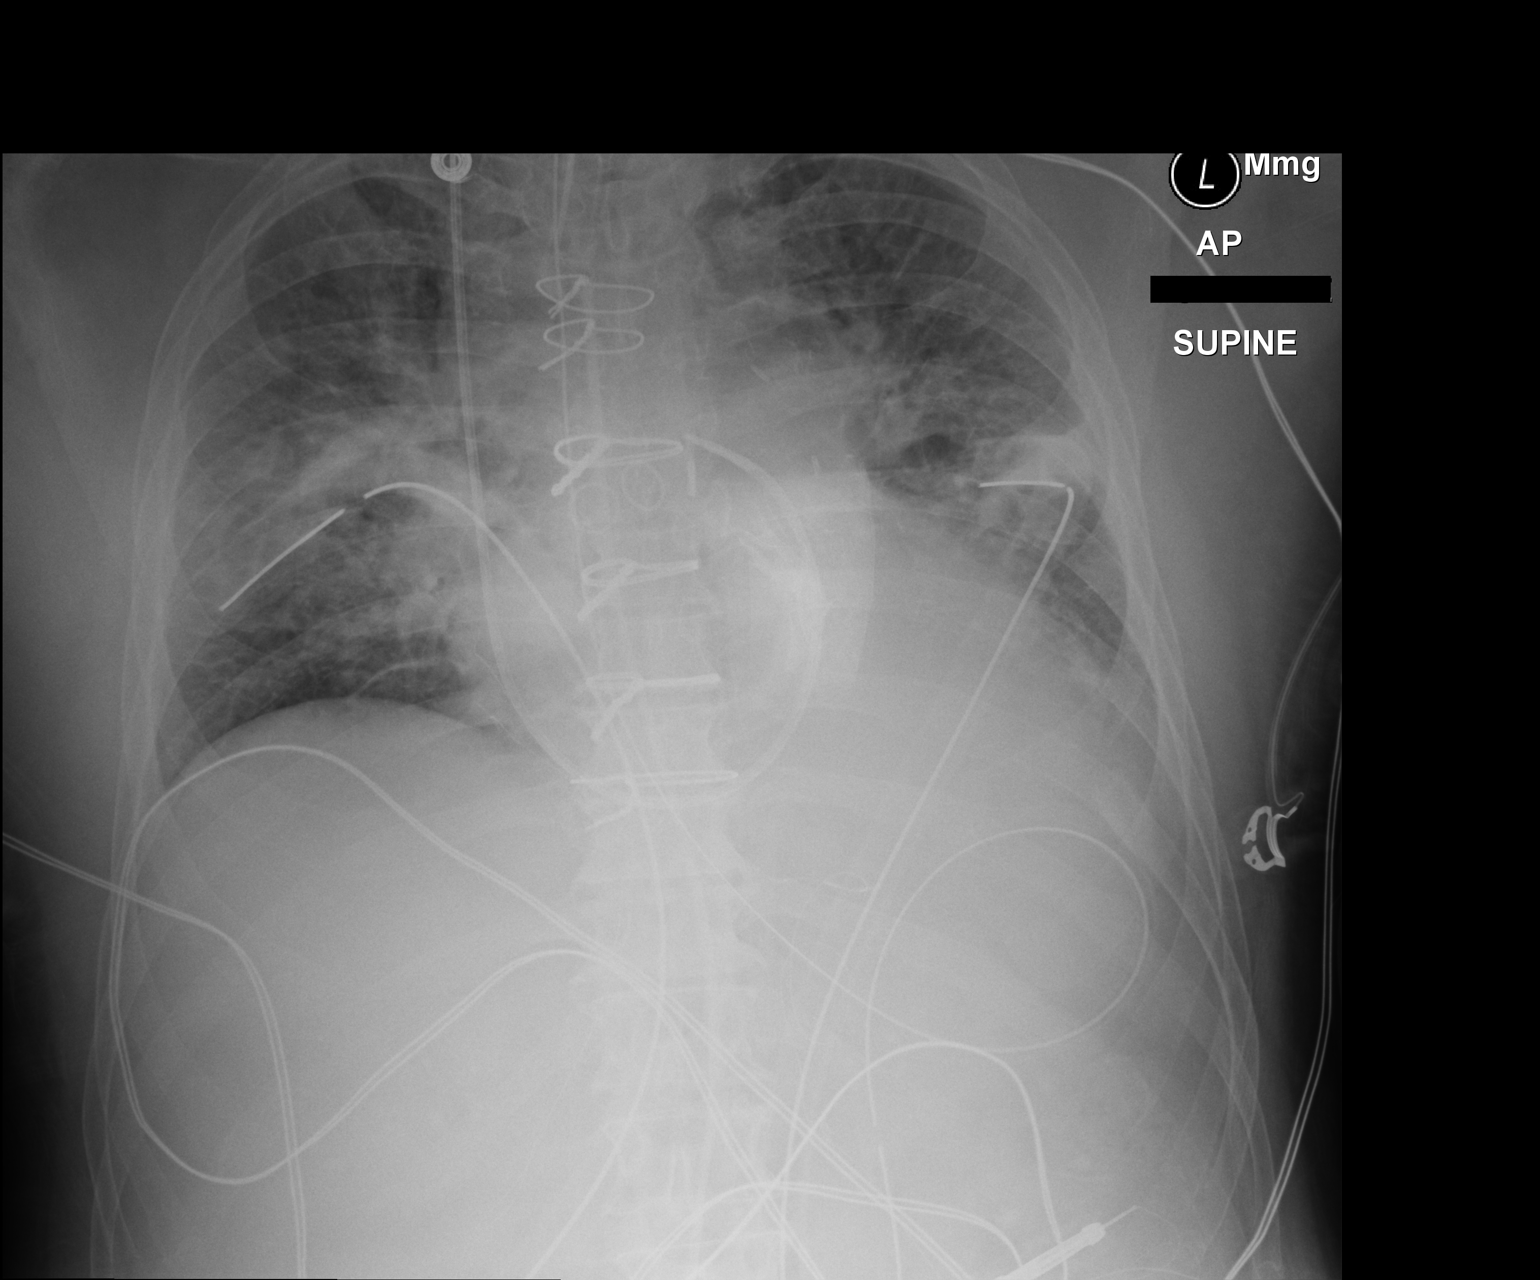

[1 of 1 positions shown; findings below may reference images not displayed]

FINDINGS: Interval postoperative changes in the mediastinum with sternotomy
wires, surgical clips, and vascular markers present. An endotracheal
tube is been placed with tip measuring 4.6 cm above the carina. An
enteric tube is been placed. Tip is off the field of view but is
below the left hemidiaphragm and in the left upper quadrant
consistent with location at least in the mid/distal stomach.
Bilateral chest tubes and mediastinal drains are present. Right
Swan-Ganz catheter with tip over the left pulmonary outflow tract.
No pneumothorax. Shallow inspiration. Normal heart size and
pulmonary vascularity. Bilateral perihilar edema.
IMPRESSION: Appliances appear in satisfactory position. Bilateral perihilar
edema.

## 2020-07-11 DEATH — deceased
